# Patient Record
Sex: Male | Born: 1970
Health system: Southern US, Community
[De-identification: ages and names within clinical notes are randomized; demographics above are authoritative.]

## PROBLEM LIST (undated history)

## (undated) DIAGNOSIS — F329 Major depressive disorder, single episode, unspecified: Secondary | ICD-10-CM

## (undated) DIAGNOSIS — M25569 Pain in unspecified knee: Secondary | ICD-10-CM

## (undated) DIAGNOSIS — R51 Headache: Secondary | ICD-10-CM

## (undated) DIAGNOSIS — B019 Varicella without complication: Secondary | ICD-10-CM

## (undated) DIAGNOSIS — M25522 Pain in left elbow: Secondary | ICD-10-CM

## (undated) DIAGNOSIS — K219 Gastro-esophageal reflux disease without esophagitis: Secondary | ICD-10-CM

## (undated) DIAGNOSIS — F32A Depression, unspecified: Secondary | ICD-10-CM

## (undated) DIAGNOSIS — R519 Headache, unspecified: Secondary | ICD-10-CM

## (undated) DIAGNOSIS — F1010 Alcohol abuse, uncomplicated: Secondary | ICD-10-CM

## (undated) DIAGNOSIS — G43909 Migraine, unspecified, not intractable, without status migrainosus: Secondary | ICD-10-CM

## (undated) HISTORY — DX: Migraine, unspecified, not intractable, without status migrainosus: G43.909

## (undated) HISTORY — DX: Headache, unspecified: R51.9

## (undated) HISTORY — DX: Pain in left elbow: M25.522

## (undated) HISTORY — DX: Gastro-esophageal reflux disease without esophagitis: K21.9

## (undated) HISTORY — PX: CYSTECTOMY: SUR359

## (undated) HISTORY — DX: Depression, unspecified: F32.A

## (undated) HISTORY — PX: CYST REMOVAL HAND: SHX6279

## (undated) HISTORY — DX: Varicella without complication: B01.9

## (undated) HISTORY — DX: Pain in unspecified knee: M25.569

## (undated) HISTORY — DX: Alcohol abuse, uncomplicated: F10.10

## (undated) HISTORY — DX: Headache: R51

## (undated) HISTORY — DX: Major depressive disorder, single episode, unspecified: F32.9

---

## 2013-08-17 ENCOUNTER — Telehealth: Payer: Self-pay | Admitting: Family Medicine

## 2013-08-17 NOTE — Telephone Encounter (Signed)
We really need to encourage some of the new providers to patients who are calling .Marland Kitchen. We can offer this to him.

## 2013-08-17 NOTE — Telephone Encounter (Signed)
Pt's aunt Juan Holloway, would like to know if you will accept pt? Pt is self pay at the moment, but trying to get insurance. Per teresa, Pt has some bad emotional issues, has been out of work 3 days, and she states he feels like he's a "loaded gun" waiting to explode.  She thinks he has high bp too. Pls advise.

## 2013-08-19 NOTE — Telephone Encounter (Signed)
Juan Landaueresa Penn states that pt really needs to sees someone. Pt has anxiety, will Juan Holloway see Juan Holloway at least for now?  pls advise if ok to schedule for monday

## 2013-08-23 ENCOUNTER — Ambulatory Visit (INDEPENDENT_AMBULATORY_CARE_PROVIDER_SITE_OTHER): Payer: Self-pay | Admitting: Physician Assistant

## 2013-08-23 ENCOUNTER — Encounter: Payer: Self-pay | Admitting: Physician Assistant

## 2013-08-23 VITALS — BP 120/80 | HR 97 | Temp 97.6°F | Resp 18 | Ht 66.5 in | Wt 215.0 lb

## 2013-08-23 DIAGNOSIS — F32A Depression, unspecified: Secondary | ICD-10-CM

## 2013-08-23 DIAGNOSIS — F411 Generalized anxiety disorder: Secondary | ICD-10-CM

## 2013-08-23 DIAGNOSIS — M25569 Pain in unspecified knee: Secondary | ICD-10-CM

## 2013-08-23 DIAGNOSIS — M25562 Pain in left knee: Principal | ICD-10-CM

## 2013-08-23 DIAGNOSIS — F3289 Other specified depressive episodes: Secondary | ICD-10-CM

## 2013-08-23 DIAGNOSIS — F329 Major depressive disorder, single episode, unspecified: Secondary | ICD-10-CM

## 2013-08-23 DIAGNOSIS — G8929 Other chronic pain: Secondary | ICD-10-CM | POA: Insufficient documentation

## 2013-08-23 MED ORDER — NAPROXEN 500 MG PO TABS: 500.0000 mg | ORAL_TABLET | Freq: Two times a day (BID) | ORAL | Status: DC

## 2013-08-23 NOTE — Patient Instructions (Signed)
We will call you with the results of your lab work when they are available.  [facility] schedule an appointment for you treatment options of your depression/anxiety.  Naproxen 500 mg twice daily with a meal for pain.  Use your knee brace on your knee daily to stabilize.  Rest, ice, compression, elevation therapy as discussed below.  If emergency symptoms discussed during visit developed, seek medical attention immediately.  Followup as needed, or for worsening or persistent symptoms despite treatment.  Schedule an annual physical today prior to leaving the office for the next 3 months or so.    Depression, Adult Depression is feeling sad, low, down in the dumps, blue, gloomy, or empty. In general, there are two kinds of depression:  Normal sadness or grief. This can happen after something upsetting. It often goes away on its own within 2 weeks. After losing a loved one (bereavement), normal sadness and grief may last longer than two weeks. It usually gets better with time.  Clinical depression. This kind lasts longer than normal sadness or grief. It keeps you from doing the things you normally do in life. It is often hard to function at home, work, or at school. It may affect your relationships with others. Treatment is often needed. GET HELP RIGHT AWAY IF:  You have thoughts about hurting yourself or others.  You lose touch with reality (psychotic symptoms). You may:  See or hear things that are not real.  Have untrue beliefs about your life or people around you.  Your medicine is giving you problems. MAKE SURE YOU:  Understand these instructions.  Will watch your condition.  Will get help right away if you are not doing well or get worse. Document Released: 03/16/2010 Document Revised: 11/06/2011 Document Reviewed: 06/13/2011 [facility] Patient Information 2015 [place], [place]. This information is not intended to replace advice  given to you by your health care provider. Make sure you discuss any questions you have with your health care provider. Knee Pain Knee pain can be a result of an injury or other medical conditions. Treatment will depend on the cause of your pain. HOME CARE  Only take medicine as told by your doctor.  Keep a healthy weight. Being overweight can make the knee hurt more.  Stretch before exercising or playing sports.  If there is constant knee pain, change the way you exercise. Ask your doctor for advice.  Make sure shoes fit well. Choose the right shoe for the sport or activity.  Protect your knees. Wear kneepads if needed.  Rest when you are tired. GET HELP RIGHT AWAY IF:   Your knee pain does not stop.  Your knee pain does not get better.  Your knee joint feels hot to the touch.  You have a fever. MAKE SURE YOU:   Understand these instructions.  Will watch this condition.  Will get help right away if you are not doing well or get worse. Document Released: 05/10/2008 Document Revised: 05/06/2011 Document Reviewed: 05/10/2008 [facility] Patient Information 2015 [place], [place]. This information is not intended to replace advice given to you by your health care provider. Make sure you discuss any questions you have with your health care provider. RICE: Routine Care for Injuries Rest, Ice, Compression, and Elevation (RICE) are often used to care for injuries. HOME CARE  Rest your injury.  Put ice on the injury.  Put ice in a plastic bag.  Place a towel between your skin and  the bag.  Leave the ice on for 15-20 minutes, 03-04 times a day. Do this for as long as told by your doctor.  Apply pressure (compression) with an elastic bandage. Remove and reapply the bandage every 3 to 4 hours. Do not wrap the bandage too tight. Wrap the bandage looser if the fingers or toes are puffy (swollen), blue, cold, painful, or lose feeling (numb).  Raise (elevate) your injury. Raise your  injury above the heart if you can. GET HELP RIGHT AWAY IF:  You have lasting pain or puffiness.  Your injury is red, weak, or loses feeling.  Your problems get worse, not better, after several days. MAKE SURE YOU:  Understand these instructions.  Will watch your condition.  Will get help right away if you are not doing well or get worse. Document Released: 07/31/2007 Document Revised: 05/06/2011 Document Reviewed: 07/13/2010 [facility] Patient Information 2015 [place], [place]. This information is not intended to replace advice given to you by your health care provider. Make sure you discuss any questions you have with your health care provider.
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
## 2013-08-24 LAB — HEPATIC FUNCTION PANEL
ALBUMIN: 4.1 g/dL (ref 3.5–5.2)
ALK PHOS: 73 U/L (ref 39–117)
ALT: 30 U/L (ref 0–53)
AST: 27 U/L (ref 0–37)
Bilirubin, Direct: 0 mg/dL (ref 0.0–0.3)
Total Protein: 7 g/dL (ref 6.0–8.3)

## 2013-08-24 LAB — CBC WITH DIFFERENTIAL/PLATELET
BASOS ABS: 0.1 10*3/uL (ref 0.0–0.1)
Basophils Relative: 0.6 % (ref 0.0–3.0)
EOS ABS: 0.1 10*3/uL (ref 0.0–0.7)
Eosinophils Relative: 1.2 % (ref 0.0–5.0)
HCT: 47.5 % (ref 39.0–52.0)
HEMOGLOBIN: 16.2 g/dL (ref 13.0–17.0)
LYMPHS PCT: 30.9 % (ref 12.0–46.0)
Lymphs Abs: 3.1 10*3/uL (ref 0.7–4.0)
MCHC: 34.2 g/dL (ref 30.0–36.0)
MCV: 88.8 fl (ref 78.0–100.0)
Monocytes Absolute: 0.7 10*3/uL (ref 0.1–1.0)
Monocytes Relative: 7.2 % (ref 3.0–12.0)
NEUTROS ABS: 6 10*3/uL (ref 1.4–7.7)
Neutrophils Relative %: 60.1 % (ref 43.0–77.0)
Platelets: 263 10*3/uL (ref 150.0–400.0)
RBC: 5.35 Mil/uL (ref 4.22–5.81)
RDW: 13.6 % (ref 11.5–15.5)
WBC: 10 10*3/uL (ref 4.0–10.5)

## 2013-08-24 LAB — TSH: TSH: 0.16 u[IU]/mL — ABNORMAL LOW (ref 0.35–4.50)

## 2013-08-30 ENCOUNTER — Other Ambulatory Visit: Payer: Self-pay | Admitting: Physician Assistant

## 2013-08-30 DIAGNOSIS — R7989 Other specified abnormal findings of blood chemistry: Secondary | ICD-10-CM

## 2014-10-28 ENCOUNTER — Telehealth: Payer: Self-pay | Admitting: Physician Assistant

## 2014-10-28 NOTE — Telephone Encounter (Signed)
Pt states he works all the time. Has no time to come in. Would like to make an appt for a "check up" But cannot schedule at this time.  Advised pt it would be a good idea if he could make that est appt

## 2015-11-02 ENCOUNTER — Encounter: Payer: Self-pay | Admitting: Family Medicine

## 2015-11-02 ENCOUNTER — Ambulatory Visit (INDEPENDENT_AMBULATORY_CARE_PROVIDER_SITE_OTHER): Payer: 59

## 2015-11-02 ENCOUNTER — Ambulatory Visit (INDEPENDENT_AMBULATORY_CARE_PROVIDER_SITE_OTHER): Payer: 59 | Admitting: Family Medicine

## 2015-11-02 VITALS — BP 135/77 | HR 67 | Temp 98.7°F | Ht 68.0 in | Wt 220.0 lb

## 2015-11-02 DIAGNOSIS — M7731 Calcaneal spur, right foot: Secondary | ICD-10-CM | POA: Diagnosis not present

## 2015-11-02 DIAGNOSIS — M25571 Pain in right ankle and joints of right foot: Secondary | ICD-10-CM

## 2015-11-02 DIAGNOSIS — G5761 Lesion of plantar nerve, right lower limb: Secondary | ICD-10-CM | POA: Diagnosis not present

## 2015-11-02 MED ORDER — DICLOFENAC SODIUM 75 MG PO TBEC: 75.0000 mg | DELAYED_RELEASE_TABLET | Freq: Two times a day (BID) | ORAL | 2 refills | Status: DC

## 2015-11-02 NOTE — Progress Notes (Signed)
Subjective:  Patient ID: Juan Holloway, male    DOB: 02-12-1971  Age: 45 y.o. MRN: [id number]  CC: New Patient (Initial Visit) (pt here today as a new pt with new onset of right foot pain. No recent injury noted but he states he can barely walk on it.)   HPI Juan Holloway presents for 3 days of increasing pain in the right foot. There is an acute 7-8/10 pain at the base of the right first toe. There is some chronic pain at the plantar surface of the heel that has been there for about a month. This is lower grade dull ache. The every pain is described as a stabbing sensation. The right second toe is numb. Patient has not been to a doctor in many years says he had some blood work several years ago. He has no known history of gout. He also states he's not had an injury either recently or in the past to this foot. He works all day on his feet as a [employer]at[employer]ic.  History Juan Holloway has a past medical history of Alcohol abuse; Chicken pox; Depression; Frequent headaches; GERD (gastroesophageal reflux disease); Knee pain; Left elbow pain; and Migraines.   He has a past surgical history that includes Cyst removal hand and Cystectomy.   His family history includes Alcoholism in his paternal grandmother; Diabetes in his father, mother, paternal grandfather, and paternal grandmother; Hypertension in his brother, father, mother, and paternal grandmother; Mental illness in his mother.He reports that he has been smoking Cigarettes.  He has been smoking about 2.00 packs per day. He has never used smokeless tobacco. He reports that he uses drugs. He reports that he does not drink alcohol.  No current outpatient prescriptions on file prior to visit.   No current facility-administered medications on file prior to visit.     ROS Review of Systems  Constitutional: Negative for chills, diaphoresis, fever and unexpected weight change.  HENT: Negative for congestion, hearing loss, rhinorrhea and sore throat.   Eyes:  Negative for visual disturbance.  Respiratory: Negative for cough and shortness of breath.   Cardiovascular: Negative for chest pain.  Gastrointestinal: Negative for abdominal pain, constipation and diarrhea.  Genitourinary: Negative for dysuria and flank pain.  Musculoskeletal: Negative for arthralgias and joint swelling.  Skin: Negative for rash.  Neurological: Negative for dizziness and headaches.  Psychiatric/Behavioral: Negative for dysphoric mood and sleep disturbance.    Objective:  BP 135/77   Pulse 67   Temp 98.7 F (37.1 C) (Oral)   Ht 5\' 8"  (1.727 m)   Wt 220 lb (99.8 kg)   BMI 33.45 kg/m   Physical Exam  Constitutional: He appears well-developed and well-nourished.  HENT:  Head: Normocephalic and atraumatic.  Right Ear: Tympanic membrane and external ear normal. No decreased hearing is noted.  Left Ear: Tympanic membrane and external ear normal. No decreased hearing is noted.  Mouth/Throat: No oropharyngeal exudate or posterior oropharyngeal erythema.  Eyes: Pupils are equal, round, and reactive to light.  Neck: Normal range of motion. Neck supple.  Cardiovascular: Normal rate and regular rhythm.   No murmur heard. Pulmonary/Chest: Breath sounds normal. No respiratory distress.  Abdominal: Soft. There is no tenderness.  Musculoskeletal: Normal range of motion. He exhibits tenderness (base of Right first MTP and interspace with  right 2nd MTP).  Vitals reviewed.  XR - right plantar heel spur. No active disease at toe  Assessment & Plan:   Juan Holloway was seen today for new patient (initial visit).  Diagnoses and all orders for this visit:  Pain in joint, ankle and foot, right -     DG Foot Complete Right; Future  Morton's metatarsalgia, neuralgia, or neuroma, right  Heel spur, right  Other orders -     diclofenac (VOLTAREN) 75 MG EC tablet; Take 1 tablet (75 mg total) by mouth 2 (two) times daily. For muscle and  Joint pain   I have discontinued Juan Holloway naproxen. I am also having him start on diclofenac.  Meds ordered this encounter  Medications  . diclofenac (VOLTAREN) 75 MG EC tablet    Sig: Take 1 tablet (75 mg total) by mouth 2 (two) times daily. For muscle and  Joint pain    Dispense:  60 tablet    Refill:  2     Follow-up: Return in about 2 weeks (around 11/16/2015) for Wellness.  Mechele Claude, M.D.

## 2015-11-08 ENCOUNTER — Ambulatory Visit: Payer: Self-pay | Admitting: Family Medicine

## 2015-11-20 ENCOUNTER — Ambulatory Visit (INDEPENDENT_AMBULATORY_CARE_PROVIDER_SITE_OTHER): Payer: 59 | Admitting: Family Medicine

## 2015-11-20 ENCOUNTER — Encounter: Payer: Self-pay | Admitting: Family Medicine

## 2015-11-20 VITALS — BP 130/72 | HR 70 | Temp 97.5°F | Ht 68.0 in | Wt 221.0 lb

## 2015-11-20 DIAGNOSIS — Z0000 Encounter for general adult medical examination without abnormal findings: Secondary | ICD-10-CM

## 2015-11-20 DIAGNOSIS — G5761 Lesion of plantar nerve, right lower limb: Secondary | ICD-10-CM | POA: Diagnosis not present

## 2015-11-20 DIAGNOSIS — IMO0001 Reserved for inherently not codable concepts without codable children: Secondary | ICD-10-CM

## 2015-11-20 LAB — URINALYSIS
BILIRUBIN UA: NEGATIVE
Glucose, UA: NEGATIVE
KETONES UA: NEGATIVE
LEUKOCYTES UA: NEGATIVE
NITRITE UA: NEGATIVE
PH UA: 6 (ref 5.0–7.5)
Protein, UA: NEGATIVE
RBC UA: NEGATIVE
SPEC GRAV UA: 1.015 (ref 1.005–1.030)
Urobilinogen, Ur: 0.2 mg/dL (ref 0.2–1.0)

## 2015-11-20 NOTE — Patient Instructions (Signed)

## 2015-11-20 NOTE — Progress Notes (Addendum)
Subjective:  Patient ID: Juan Holloway, male    DOB: 1970/12/08  Age: 45 y.o. MRN: [id number]  CC: Annual Exam   HPI Juan Holloway presents for New patient CPE Depression screen [facility] 2/9 11/20/2015 11/02/2015  Decreased Interest 0 0  Down, Depressed, Hopeless 0 0  PHQ - 2 Score 0 0  Foot pain slightly decreased. Still 5-6/10 in spite of diclofenac.  History Juan Holloway has a past medical history of Alcohol abuse; Chicken pox; Depression; Frequent headaches; GERD (gastroesophageal reflux disease); Knee pain; Left elbow pain; and Migraines.   He has a past surgical history that includes Cyst removal hand and Cystectomy.   His family history includes Alcoholism in his paternal grandmother; Diabetes in his father, mother, paternal grandfather, and paternal grandmother; Hypertension in his brother, father, mother, and paternal grandmother; Mental illness in his mother.He reports that he has been smoking Cigarettes.  He has been smoking about 2.00 packs per day. He has never used smokeless tobacco. He reports that he uses drugs. He reports that he does not drink alcohol.  Current Outpatient Prescriptions on File Prior to Visit  Medication Sig Dispense Refill  . diclofenac (VOLTAREN) 75 MG EC tablet Take 1 tablet (75 mg total) by mouth 2 (two) times daily. For muscle and  Joint pain 60 tablet 2   No current facility-administered medications on file prior to visit.     ROS Review of Systems  Constitutional: Negative for activity change, appetite change, chills, diaphoresis, fatigue, fever and unexpected weight change.  HENT: Negative for congestion, ear pain, hearing loss, postnasal drip, rhinorrhea, sore throat, tinnitus and trouble swallowing.   Eyes: Negative for photophobia, pain, discharge and redness.  Respiratory: Negative for apnea, cough, choking, chest tightness, shortness of breath, wheezing and stridor.   Cardiovascular: Negative for chest pain, palpitations and leg swelling.    Gastrointestinal: Negative for abdominal distention, abdominal pain, blood in stool, constipation, diarrhea, nausea and vomiting.  Endocrine: Negative for cold intolerance, heat intolerance, polydipsia, polyphagia and polyuria.  Genitourinary: Negative for difficulty urinating, dysuria, enuresis, flank pain, frequency, genital sores, hematuria and urgency.  Musculoskeletal: Positive for arthralgias (knees - improved with diclofenac). Negative for joint swelling.  Skin: Negative for color change, rash and wound.  Allergic/Immunologic: Negative for immunocompromised state.  Neurological: Negative for dizziness, tremors, seizures, syncope, facial asymmetry, speech difficulty, weakness, light-headedness, numbness and headaches.  Hematological: Does not bruise/bleed easily.  Psychiatric/Behavioral: Negative for agitation, behavioral problems, confusion, decreased concentration, dysphoric mood, hallucinations, sleep disturbance and suicidal ideas. The patient is not nervous/anxious and is not hyperactive.     Objective:  BP 130/72   Pulse 70   Temp 97.5 F (36.4 C) (Oral)   Ht '5\' 8"'  (1.727 m)   Wt 221 lb (100.2 kg)   BMI 33.60 kg/m   Physical Exam  Constitutional: He is oriented to person, place, and time. He appears well-developed and well-nourished.  HENT:  Head: Normocephalic and atraumatic.  Mouth/Throat: Oropharynx is clear and moist.  Eyes: EOM are normal. Pupils are equal, round, and reactive to light.  Neck: Normal range of motion. No tracheal deviation present. No thyromegaly present.  Cardiovascular: Normal rate, regular rhythm and normal heart sounds.  Exam reveals no gallop and no friction rub.   No murmur heard. Pulmonary/Chest: Breath sounds normal. He has no wheezes. He has no rales.  Abdominal: Soft. He exhibits no mass. There is no tenderness.  Musculoskeletal: Normal range of motion. He exhibits tenderness (plantar surface, base of first toe). He  exhibits no edema.   Neurological: He is alert and oriented to person, place, and time.  Skin: Skin is warm and dry.  Psychiatric: He has a normal mood and affect.    Assessment & Plan:   Juan Holloway was seen today for annual exam.  Diagnoses and all orders for this visit:  Well adult -     CBC with Differential/Platelet -     CMP14+EGFR -     Lipid panel -     PSA Total (Reflex To Free) -     Urinalysis  Morton's metatarsalgia, neuralgia, or neuroma, right -     Ambulatory referral to Podiatry  A steroid injection was performed at the plantar aspect of the 1-2 MTP joint usingsterile prep & drape injected 1 ml marcan and 3 mg of Celestone. This was well tolerated.  I am having Juan Holloway maintain his diclofenac.  No orders of the defined types were placed in this encounter.  Calorie counting for weight loss printed  Follow-up: Return in about 1 year (around 11/19/2016).  Claretta Fraise, M.D.

## 2015-11-21 ENCOUNTER — Telehealth: Payer: Self-pay | Admitting: Family Medicine

## 2015-11-21 ENCOUNTER — Other Ambulatory Visit: Payer: Self-pay | Admitting: *Deleted

## 2015-11-21 DIAGNOSIS — E785 Hyperlipidemia, unspecified: Secondary | ICD-10-CM

## 2015-11-21 LAB — CMP14+EGFR
ALT: 49 IU/L — AB (ref 0–44)
AST: 30 IU/L (ref 0–40)
Albumin/Globulin Ratio: 1.8 (ref 1.2–2.2)
Albumin: 4.4 g/dL (ref 3.5–5.5)
Alkaline Phosphatase: 82 IU/L (ref 39–117)
BUN/Creatinine Ratio: 13 (ref 9–20)
BUN: 14 mg/dL (ref 6–24)
Bilirubin Total: 0.4 mg/dL (ref 0.0–1.2)
CALCIUM: 9.5 mg/dL (ref 8.7–10.2)
CO2: 25 mmol/L (ref 18–29)
Chloride: 98 mmol/L (ref 96–106)
Creatinine, Ser: 1.04 mg/dL (ref 0.76–1.27)
GFR, EST AFRICAN AMERICAN: 100 mL/min/{1.73_m2} (ref >59)
GFR, EST NON AFRICAN AMERICAN: 86 mL/min/{1.73_m2} (ref >59)
GLOBULIN, TOTAL: 2.5 g/dL (ref 1.5–4.5)
Glucose: 102 mg/dL — ABNORMAL HIGH (ref 65–99)
Potassium: 4.8 mmol/L (ref 3.5–5.2)
Sodium: 138 mmol/L (ref 134–144)
Total Protein: 6.9 g/dL (ref 6.0–8.5)

## 2015-11-21 LAB — CBC WITH DIFFERENTIAL/PLATELET
BASOS: 1 %
Basophils Absolute: 0 10*3/uL (ref 0.0–0.2)
EOS (ABSOLUTE): 0.1 10*3/uL (ref 0.0–0.4)
EOS: 1 %
HEMATOCRIT: 49.2 % (ref 37.5–51.0)
HEMOGLOBIN: 17.1 g/dL (ref 12.6–17.7)
IMMATURE GRANULOCYTES: 0 %
Immature Grans (Abs): 0 10*3/uL (ref 0.0–0.1)
LYMPHS: 38 %
Lymphocytes Absolute: 3 10*3/uL (ref 0.7–3.1)
MCH: 29.9 pg (ref 26.6–33.0)
MCHC: 34.8 g/dL (ref 31.5–35.7)
MCV: 86 fL (ref 79–97)
MONOCYTES: 10 %
Monocytes Absolute: 0.8 10*3/uL (ref 0.1–0.9)
NEUTROS ABS: 3.9 10*3/uL (ref 1.4–7.0)
Neutrophils: 50 %
PLATELETS: 276 10*3/uL (ref 150–379)
RBC: 5.72 x10E6/uL (ref 4.14–5.80)
RDW: 13.6 % (ref 12.3–15.4)
WBC: 7.8 10*3/uL (ref 3.4–10.8)

## 2015-11-21 LAB — PSA TOTAL (REFLEX TO FREE): Prostate Specific Ag, Serum: 1.1 ng/mL (ref 0.0–4.0)

## 2015-11-21 LAB — LIPID PANEL
CHOL/HDL RATIO: 7.6 ratio — AB (ref 0.0–5.0)
Cholesterol, Total: 228 mg/dL — ABNORMAL HIGH (ref 100–199)
HDL: 30 mg/dL — AB (ref >39)
Triglycerides: 485 mg/dL — ABNORMAL HIGH (ref 0–149)

## 2015-11-21 NOTE — Telephone Encounter (Signed)
Patient aware of results.

## 2016-01-26 ENCOUNTER — Other Ambulatory Visit: Payer: Self-pay | Admitting: Family Medicine

## 2016-03-05 ENCOUNTER — Other Ambulatory Visit: Payer: Self-pay | Admitting: Family Medicine

## 2016-04-05 ENCOUNTER — Other Ambulatory Visit: Payer: Self-pay | Admitting: Family Medicine

## 2016-06-20 ENCOUNTER — Encounter: Payer: Self-pay | Admitting: Family Medicine

## 2016-06-20 ENCOUNTER — Ambulatory Visit (INDEPENDENT_AMBULATORY_CARE_PROVIDER_SITE_OTHER): Payer: 59 | Admitting: Family Medicine

## 2016-06-20 ENCOUNTER — Ambulatory Visit (INDEPENDENT_AMBULATORY_CARE_PROVIDER_SITE_OTHER): Payer: 59

## 2016-06-20 VITALS — BP 131/83 | HR 71 | Temp 97.2°F | Ht 68.0 in | Wt 217.2 lb

## 2016-06-20 DIAGNOSIS — M25562 Pain in left knee: Secondary | ICD-10-CM

## 2016-06-20 MED ORDER — LIDOCAINE HCL 1 % IJ SOLN
10.0000 mL | Freq: Once | INTRAMUSCULAR | Status: AC
  Administered 2016-06-20: 10 mL via INTRADERMAL

## 2016-06-20 MED ORDER — TRIAMCINOLONE ACETONIDE 40 MG/ML IJ SUSP
40.0000 mg | Freq: Once | INTRAMUSCULAR | Status: AC
  Administered 2016-06-20: 40 mg via INTRAMUSCULAR

## 2016-06-20 NOTE — Patient Instructions (Signed)
Great to see you!  Come back with any concerns or for return of pain.
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
## 2016-06-20 NOTE — Addendum Note (Signed)
Addended by: Angela Adam on: 06/20/2016 04:14 PM   Modules accepted: Orders

## 2016-06-29 ENCOUNTER — Other Ambulatory Visit: Payer: Self-pay | Admitting: Family Medicine

## 2016-08-02 ENCOUNTER — Other Ambulatory Visit: Payer: Self-pay | Admitting: Family Medicine

## 2016-08-10 ENCOUNTER — Other Ambulatory Visit: Payer: Self-pay | Admitting: Family Medicine

## 2016-08-15 ENCOUNTER — Ambulatory Visit (INDEPENDENT_AMBULATORY_CARE_PROVIDER_SITE_OTHER): Payer: 59 | Admitting: Family Medicine

## 2016-08-15 ENCOUNTER — Encounter: Payer: Self-pay | Admitting: Family Medicine

## 2016-08-15 VITALS — BP 129/75 | HR 85 | Temp 97.5°F | Ht 68.0 in | Wt 212.6 lb

## 2016-08-15 DIAGNOSIS — M25562 Pain in left knee: Secondary | ICD-10-CM | POA: Diagnosis not present

## 2016-08-15 DIAGNOSIS — G8929 Other chronic pain: Secondary | ICD-10-CM | POA: Diagnosis not present

## 2016-08-15 DIAGNOSIS — R21 Rash and other nonspecific skin eruption: Secondary | ICD-10-CM

## 2016-08-15 MED ORDER — TRIAMCINOLONE ACETONIDE 0.5 % EX OINT: 1.0000 "application " | TOPICAL_OINTMENT | Freq: Two times a day (BID) | CUTANEOUS | 1 refills | Status: DC

## 2016-08-15 MED ORDER — PANTOPRAZOLE SODIUM 20 MG PO TBEC: 20.0000 mg | DELAYED_RELEASE_TABLET | Freq: Every day | ORAL | 5 refills | Status: DC

## 2016-08-15 MED ORDER — DICLOFENAC SODIUM 75 MG PO TBEC: 75.0000 mg | DELAYED_RELEASE_TABLET | Freq: Two times a day (BID) | ORAL | 3 refills | Status: DC

## 2016-08-15 NOTE — Patient Instructions (Signed)
Great to see you!  Try some time off of diclofenac if you can, if you need it twice daily start using protonix  1 pill once daily for it.   Try the ointment on your leg to see if it helps the bumps.

## 2016-08-15 NOTE — Progress Notes (Signed)
   HPI  Patient presents today here for follow-up chronic knee pain, also with rash.  Knee pain Left knee pain long-term, patient reports cracking and popping when he walks. He states that after his previous joint injection he's had much improvement. Patient states that he has not had much pain recently, however he takes diclofenac twice daily scheduled. We have talked extensively today about risk of peptic ulcer disease development, NSAID-induced nephropathy, and increased risk of MI with chronic NSAID use.  Rash Patient gets painful red bumps with intermittent white blisters on the bilateral posterior calves during the summer frequently. He believes this is due to heat.  PMH: Smoking status noted ROS: Per HPI  Objective: BP 129/75   Pulse 85   Temp 97.5 F (36.4 C) (Oral)   Ht 5\' 8"  (1.727 m)   Wt 212 lb 9.6 oz (96.4 kg)   BMI 32.33 kg/m  Gen: NAD, alert, cooperative with exam HEENT: NCAT CV: RRR, good S1/S2, no murmur Resp: CTABL, no wheezes, non-labored Ext: No edema, warm Neuro: Alert and oriented, No gross deficits MSK: L knee without erythema, effusion, bruising, or gross deformity No joint line tenderness.  ligamentously intact to Lachman's and with varus and valgus stress.  Negative McMurray's test  Skin 3 erythematous papules on the right calf approximately 5-8 mm in diameter, no drainage, no tenderness to palpation, 10-20 scattered smaller papules 1-3 mm in diameter distributed along bilateral posterior calves  Assessment and plan:  # Chronic knee pain Patient improved after knee injection, recommended a trial off of medication and using only as needed Refill Voltaren, explained that if he goes back to twice daily scheduled dosing I would recommend starting a PPI to prevent PUD. Offered/recommended orthopedic referral, patient states that this is difficult to afford at this time  # Rash Unclear etiology, trial of Kenalog ointment No red flags    Meds  ordered this encounter  Medications  . diclofenac (VOLTAREN) 75 MG EC tablet    Sig: Take 1 tablet (75 mg total) by mouth 2 (two) times daily. For muscle and  Joint pain    Dispense:  60 tablet    Refill:  3  . pantoprazole (PROTONIX) 20 MG tablet    Sig: Take 1 tablet (20 mg total) by mouth daily.    Dispense:  30 tablet    Refill:  5  . triamcinolone ointment (KENALOG) 0.5 %    Sig: Apply 1 application topically 2 (two) times daily.    Dispense:  30 g    Refill:  1    Murtis SinkSam Bradshaw, MD [facility] 08/15/2016, 5:10 PM

## 2017-01-12 ENCOUNTER — Other Ambulatory Visit: Payer: Self-pay | Admitting: Family Medicine

## 2017-01-13 NOTE — Telephone Encounter (Signed)
Last seen 08/15/16  Dr B

## 2017-03-13 ENCOUNTER — Other Ambulatory Visit: Payer: Self-pay | Admitting: Family Medicine

## 2017-03-13 NOTE — Telephone Encounter (Signed)
Last seen 08/15/16  Dr Ermalinda MemosBradshaw

## 2017-04-12 ENCOUNTER — Other Ambulatory Visit: Payer: Self-pay | Admitting: Family Medicine

## 2017-04-14 NOTE — Telephone Encounter (Signed)
Authorize 30 days only. Then contact the patient letting them know that they will need an appointment before any further prescriptions can be sent in. 

## 2017-04-14 NOTE — Telephone Encounter (Signed)
Attempted to contact patient - NA °

## 2017-04-14 NOTE — Telephone Encounter (Signed)
Last seen 08/15/16

## 2017-04-16 ENCOUNTER — Other Ambulatory Visit: Payer: Self-pay | Admitting: Family Medicine

## 2017-04-21 ENCOUNTER — Encounter: Payer: Self-pay | Admitting: Family Medicine

## 2017-04-21 ENCOUNTER — Ambulatory Visit: Payer: BLUE CROSS/BLUE SHIELD | Admitting: Family Medicine

## 2017-04-21 VITALS — BP 128/68 | HR 75 | Temp 96.8°F | Ht 68.0 in | Wt 219.0 lb

## 2017-04-21 DIAGNOSIS — M545 Low back pain, unspecified: Secondary | ICD-10-CM

## 2017-04-21 MED ORDER — METHYLPREDNISOLONE ACETATE 80 MG/ML IJ SUSP
80.0000 mg | Freq: Once | INTRAMUSCULAR | Status: AC
  Administered 2017-04-21: 80 mg via INTRAMUSCULAR

## 2017-04-21 MED ORDER — PREDNISONE 20 MG PO TABS: 40.0000 mg | ORAL_TABLET | Freq: Every day | ORAL | 0 refills | Status: DC

## 2017-04-21 MED ORDER — TIZANIDINE HCL 4 MG PO CAPS: 4.0000 mg | ORAL_CAPSULE | Freq: Three times a day (TID) | ORAL | 0 refills | Status: DC | PRN

## 2017-04-21 MED ORDER — OXYCODONE-ACETAMINOPHEN 5-325 MG PO TABS: 1.0000 | ORAL_TABLET | Freq: Four times a day (QID) | ORAL | 0 refills | Status: DC | PRN

## 2017-04-21 NOTE — Progress Notes (Signed)
   HPI  Patient presents today here with back pain.  Patient complains of acute onset right-sided lower thoracic/upper lumbar back pain.  Patient states that he was driving to work on Friday morning when he sneezed and had a sudden onset of the pain.  He states when he got out of his car at work he had severe pain. He was able to finish his day states that over the weekend is beginning to get worse and has had a difficult time even getting up to walk.  Patient states that it hurts on the right side of the spine and radiates around to the mid axillary area. No bowel or bladder dysfunction, no saddle in the  He is tried Aleve with no improvement. He is also tried Biofreeze with no improvement  PMH: Smoking status noted ROS: Per HPI  Objective: BP 128/68   Pulse 75   Temp (!) 96.8 F (36 C) (Oral)   Ht 5\' 8"  (1.727 m)   Wt 219 lb (99.3 kg)   BMI 33.30 kg/m  Gen: NAD, alert, cooperative with exam, patient clearly in pain HEENT: NCAT CV: RRR, good S1/S2, no murmur Resp: CTABL, no wheezes, non-labored Ext: No edema, warm Neuro: Alert and oriented, strength 5/5 and sensation intact in bilateral lower patellar tendon reflexes MSK Tenderness to palpation of right-sided paraspinal muscles in the upper lumbar/lower thoracic area.   Assessment and plan:  #Right-sided low back pain without sciatica Treating with IM Depo-Medrol today plus prednisone burst. Also given Zanaflex for muscle relaxer Also given short course of Percocet for severe pain, we have discussed that this is a short-term medication and will not be refilled.   Meds ordered this encounter  Medications  . oxyCODONE-acetaminophen (PERCOCET/ROXICET) 5-325 MG tablet    Sig: Take 1 tablet by mouth every 6 (six) hours as needed for severe pain.    Dispense:  20 tablet    Refill:  0  . predniSONE (DELTASONE) 20 MG tablet    Sig: Take 2 tablets (40 mg total) by mouth daily with breakfast.    Dispense:  10 tablet   Refill:  0  . tiZANidine (ZANAFLEX) 4 MG capsule    Sig: Take 1 capsule (4 mg total) by mouth 3 (three) times daily as needed for muscle spasms.    Dispense:  30 capsule    Refill:  0  . methylPREDNISolone acetate (DEPO-MEDROL) injection 80 mg    Murtis SinkSam Bradshaw, MD [facility] 04/21/2017, 1:35 PM

## 2017-04-21 NOTE — Patient Instructions (Signed)
Great to see you!  Start prednisone today, use tizanidine at night.   Use oxycodone only as needed, do not drive after you take it.

## 2017-05-16 ENCOUNTER — Other Ambulatory Visit: Payer: Self-pay | Admitting: Family Medicine

## 2017-08-08 ENCOUNTER — Other Ambulatory Visit: Payer: Self-pay | Admitting: Family Medicine

## 2017-09-12 ENCOUNTER — Other Ambulatory Visit: Payer: Self-pay | Admitting: Family Medicine

## 2017-09-12 NOTE — Telephone Encounter (Signed)
Last seen 04/21/17  Dr Ermalinda MemosBradshaw  Dr Darlyn ReadStacks PCP

## 2017-10-21 ENCOUNTER — Encounter: Payer: Self-pay | Admitting: Family Medicine

## 2017-10-21 ENCOUNTER — Ambulatory Visit: Payer: BLUE CROSS/BLUE SHIELD | Admitting: Family Medicine

## 2017-10-21 VITALS — BP 132/77 | HR 64 | Temp 97.7°F | Ht 68.0 in | Wt 217.5 lb

## 2017-10-21 DIAGNOSIS — F3289 Other specified depressive episodes: Secondary | ICD-10-CM

## 2017-10-21 MED ORDER — ESCITALOPRAM OXALATE 10 MG PO TABS: 10.0000 mg | ORAL_TABLET | Freq: Every day | ORAL | 0 refills | Status: DC

## 2017-10-21 NOTE — Progress Notes (Signed)
Subjective:  Patient ID: Arun Herrod, male    DOB: 04-19-1970  Age: 47 y.o. MRN: [id number]  CC: Depression   HPI Emigdio Wildeman presents for increasing sadness, lack of interest, sadness. Sx noted below. Onset several weeks ago. Worsening daily.  Depression screen [facility] 2/9 10/21/2017 04/21/2017 08/15/2016  Decreased Interest 3 0 0  Down, Depressed, Hopeless 3 0 0  PHQ - 2 Score 6 0 0  Altered sleeping 1 - -  Tired, decreased energy 2 - -  Change in appetite 2 - -  Feeling bad or failure about yourself  3 - -  Trouble concentrating 1 - -  Moving slowly or fidgety/restless 2 - -  Suicidal thoughts 3 - -  PHQ-9 Score 20 - -    History Theresa has a past medical history of Alcohol abuse, Chicken pox, Depression, Frequent headaches, GERD (gastroesophageal reflux disease), Knee pain, Left elbow pain, and Migraines.   He has a past surgical history that includes Cyst removal hand and Cystectomy.   His family history includes Alcoholism in his paternal grandmother; Arthritis in his unknown relative; Diabetes in his father, mother, paternal grandfather, and paternal grandmother; Hypertension in his brother, father, mother, and paternal grandmother; Mental illness in his mother.He reports that he has been smoking cigarettes. He has been smoking about 2.00 packs per day. He has never used smokeless tobacco. He reports that he has current or past drug history. He reports that he does not drink alcohol.    ROS Review of Systems  Constitutional: Negative.   HENT: Negative.   Eyes: Negative for visual disturbance.  Respiratory: Negative for cough and shortness of breath.   Cardiovascular: Negative for chest pain and leg swelling.  Gastrointestinal: Negative for abdominal pain, diarrhea, nausea and vomiting.  Genitourinary: Negative for difficulty urinating.  Musculoskeletal: Negative for arthralgias and myalgias.  Skin: Negative for rash.  Neurological: Negative for headaches.    Psychiatric/Behavioral: Negative for sleep disturbance.    Objective:  BP 132/77   Pulse 64   Temp 97.7 F (36.5 C) (Oral)   Ht '5\' 8"'  (1.727 m)   Wt 217 lb 8 oz (98.7 kg)   BMI 33.07 kg/m   BP Readings from Last 3 Encounters:  10/21/17 132/77  04/21/17 128/68  08/15/16 129/75    Wt Readings from Last 3 Encounters:  10/21/17 217 lb 8 oz (98.7 kg)  04/21/17 219 lb (99.3 kg)  08/15/16 212 lb 9.6 oz (96.4 kg)     Physical Exam  Constitutional: He is oriented to person, place, and time. He appears well-developed and well-nourished. No distress.  HENT:  Head: Normocephalic and atraumatic.  Right Ear: External ear normal.  Left Ear: External ear normal.  Nose: Nose normal.  Mouth/Throat: Oropharynx is clear and moist.  Eyes: Pupils are equal, round, and reactive to light. Conjunctivae and EOM are normal.  Neck: Normal range of motion. Neck supple.  Cardiovascular: Normal rate, regular rhythm and normal heart sounds.  No murmur heard. Pulmonary/Chest: Effort normal and breath sounds normal. No respiratory distress. He has no wheezes. He has no rales.  Abdominal: Soft. There is no tenderness.  Musculoskeletal: Normal range of motion.  Neurological: He is alert and oriented to person, place, and time. He has normal reflexes.  Skin: Skin is warm and dry.  Psychiatric: Judgment and thought content normal. His affect is blunt. His speech is delayed. He is slowed and withdrawn. Cognition and memory are normal. He exhibits a depressed mood.  Assessment & Plan:   Theophil was seen today for depression.  Diagnoses and all orders for this visit:  Other depression -     CBC with Differential/Platelet -     CMP14+EGFR -     TSH  Other orders -     escitalopram (LEXAPRO) 10 MG tablet; Take 1 tablet (10 mg total) by mouth at bedtime.       I have discontinued Layla Barter triamcinolone ointment, oxyCODONE-acetaminophen, predniSONE, and tiZANidine. I am also having  him start on escitalopram. Additionally, I am having him maintain his pantoprazole and diclofenac.  Allergies as of 10/21/2017   No Known Allergies     Medication List        Accurate as of 10/21/17 11:59 PM. Always use your most recent med list.          diclofenac 75 MG EC tablet Commonly known as:  VOLTAREN TAKE 1 TABLET (75 MG TOTAL) BY MOUTH 2 (TWO) TIMES DAILY. FOR MUSCLE AND JOINT PAIN   escitalopram 10 MG tablet Commonly known as:  LEXAPRO Take 1 tablet (10 mg total) by mouth at bedtime.   pantoprazole 20 MG tablet Commonly known as:  PROTONIX TAKE 1 TABLET BY MOUTH EVERY DAY        Follow-up: No follow-ups on file.  Claretta Fraise, M.D.

## 2017-10-22 LAB — CMP14+EGFR
ALK PHOS: 82 IU/L (ref 39–117)
ALT: 37 IU/L (ref 0–44)
AST: 24 IU/L (ref 0–40)
Albumin/Globulin Ratio: 1.7 (ref 1.2–2.2)
Albumin: 4.3 g/dL (ref 3.5–5.5)
BILIRUBIN TOTAL: 0.2 mg/dL (ref 0.0–1.2)
BUN/Creatinine Ratio: 14 (ref 9–20)
BUN: 15 mg/dL (ref 6–24)
CHLORIDE: 101 mmol/L (ref 96–106)
CO2: 26 mmol/L (ref 20–29)
CREATININE: 1.07 mg/dL (ref 0.76–1.27)
Calcium: 9.2 mg/dL (ref 8.7–10.2)
GFR calc Af Amer: 95 mL/min/{1.73_m2} (ref >59)
GFR calc non Af Amer: 82 mL/min/{1.73_m2} (ref >59)
Globulin, Total: 2.5 g/dL (ref 1.5–4.5)
Glucose: 117 mg/dL — ABNORMAL HIGH (ref 65–99)
Potassium: 4.3 mmol/L (ref 3.5–5.2)
Sodium: 142 mmol/L (ref 134–144)
Total Protein: 6.8 g/dL (ref 6.0–8.5)

## 2017-10-22 LAB — CBC WITH DIFFERENTIAL/PLATELET
Basophils Absolute: 0 10*3/uL (ref 0.0–0.2)
Basos: 0 %
EOS (ABSOLUTE): 0.1 10*3/uL (ref 0.0–0.4)
Eos: 2 %
Hematocrit: 45.5 % (ref 37.5–51.0)
Hemoglobin: 15.9 g/dL (ref 13.0–17.7)
IMMATURE GRANS (ABS): 0 10*3/uL (ref 0.0–0.1)
Immature Granulocytes: 0 %
LYMPHS ABS: 3.7 10*3/uL — AB (ref 0.7–3.1)
LYMPHS: 45 %
MCH: 30.2 pg (ref 26.6–33.0)
MCHC: 34.9 g/dL (ref 31.5–35.7)
MCV: 87 fL (ref 79–97)
MONOCYTES: 9 %
Monocytes Absolute: 0.7 10*3/uL (ref 0.1–0.9)
NEUTROS ABS: 3.6 10*3/uL (ref 1.4–7.0)
Neutrophils: 44 %
PLATELETS: 251 10*3/uL (ref 150–450)
RBC: 5.26 x10E6/uL (ref 4.14–5.80)
RDW: 13.6 % (ref 12.3–15.4)
WBC: 8.2 10*3/uL (ref 3.4–10.8)

## 2017-10-22 LAB — TSH: TSH: 2.14 u[IU]/mL (ref 0.450–4.500)

## 2017-10-27 ENCOUNTER — Encounter: Payer: Self-pay | Admitting: Family Medicine

## 2017-11-05 ENCOUNTER — Encounter: Payer: Self-pay | Admitting: Family Medicine

## 2017-11-05 ENCOUNTER — Ambulatory Visit: Payer: BLUE CROSS/BLUE SHIELD | Admitting: Family Medicine

## 2017-11-05 VITALS — BP 126/68 | HR 51 | Temp 97.5°F | Ht 68.0 in | Wt 216.5 lb

## 2017-11-05 DIAGNOSIS — F3289 Other specified depressive episodes: Secondary | ICD-10-CM | POA: Diagnosis not present

## 2017-11-05 MED ORDER — ESCITALOPRAM OXALATE 20 MG PO TABS: 20.0000 mg | ORAL_TABLET | Freq: Every day | ORAL | 2 refills | Status: DC

## 2017-11-05 NOTE — Progress Notes (Signed)
Chief Complaint  Patient presents with  . Depression    HPI  Patient presents today for follow-up on his depression.  No formal PHQ score was obtained but we did informally go over symptoms related to his depression from his last score.  He still has some feeling sad and down almost every day.  He is sleeping somewhat better and he has no desire to hurt himself or anyone else.  He feels like his mood is a little bit better but not in remission.  He denies any side effects from the Lexapro.  PMH: Smoking status noted ROS: Per HPI  Objective: BP 126/68   Pulse (!) 51   Temp (!) 97.5 F (36.4 C) (Oral)   Ht 5\' 8"  (1.727 m)   Wt 216 lb 8 oz (98.2 kg)   BMI 32.92 kg/m   Gen: NAD, alert, cooperative with exam HEENT: NCAT, EOMI, PERRL CV: RRR, good S1/S2, no murmur Resp: CTABL, no wheezes, non-labored Abd: SNTND, BS present, no guarding or organomegaly Ext: No edema, warm Neuro: Alert and oriented, No gross deficits  Assessment and plan:  1. Other depression     Meds ordered this encounter  Medications  . escitalopram (LEXAPRO) 20 MG tablet    Sig: Take 1 tablet (20 mg total) by mouth at bedtime.    Dispense:  30 tablet    Refill:  2    No orders of the defined types were placed in this encounter.   Follow up in 1 month.  I encouraged him to seek counseling.  He still has the printout of counselors in the area that he was given last time he was here.  He agrees to call but admits he has not done that as yet. Mechele Claude, MD

## 2017-11-14 ENCOUNTER — Other Ambulatory Visit: Payer: Self-pay | Admitting: Family Medicine

## 2017-12-04 ENCOUNTER — Other Ambulatory Visit: Payer: Self-pay | Admitting: Family Medicine

## 2017-12-08 ENCOUNTER — Ambulatory Visit: Payer: BLUE CROSS/BLUE SHIELD | Admitting: Family Medicine

## 2017-12-08 ENCOUNTER — Encounter: Payer: Self-pay | Admitting: Family Medicine

## 2017-12-08 VITALS — BP 131/75 | HR 66 | Temp 97.8°F | Ht 68.0 in | Wt 217.0 lb

## 2017-12-08 DIAGNOSIS — F3289 Other specified depressive episodes: Secondary | ICD-10-CM

## 2017-12-08 NOTE — Progress Notes (Signed)
Chief Complaint  Patient presents with  . Depression    1 mo follow up     HPI  Patient presents today for recheck of depression.  Things are going much better for him to the Lexapro agrees with him.  He denies any side effects.  Particularly he has just a little bit of trouble waking up in the morning but he just sets his alarm clock a little louder and has an extra cup of coffee.  Depression screen [facility] 2/9 12/08/2017 11/05/2017 10/21/2017 04/21/2017 08/15/2016  Decreased Interest 1 1 3  0 0  Down, Depressed, Hopeless 1 2 3  0 0  PHQ - 2 Score 2 3 6  0 0  Altered sleeping 1 1 1  - -  Tired, decreased energy 1 1 2  - -  Change in appetite 1 2 2  - -  Feeling bad or failure about yourself  1 2 3  - -  Trouble concentrating 0 0 1 - -  Moving slowly or fidgety/restless 1 2 2  - -  Suicidal thoughts 0 0 3 - -  PHQ-9 Score 7 11 20  - -     PMH: Smoking status noted ROS: Per HPI  Objective: BP 131/75 (BP Location: Left Arm)   Pulse 66   Temp 97.8 F (36.6 C) (Oral)   Ht 5\' 8"  (1.727 m)   Wt 217 lb (98.4 kg)   BMI 32.99 kg/m  Gen: NAD, alert, cooperative with exam HEENT: NCAT, EOMI, PERRL CV: RRR, good S1/S2, no murmur Resp: CTABL, no wheezes, non-labored Abd: SNTND, BS present, no guarding or organomegaly Ext: No edema, warm Neuro: Alert and oriented, No gross deficits  Assessment and plan:  1. Other depression     No orders of the defined types were placed in this encounter.   No orders of the defined types were placed in this encounter.   Follow up as needed.  Mechele Claude, MD

## 2018-01-09 ENCOUNTER — Other Ambulatory Visit: Payer: Self-pay | Admitting: Family Medicine

## 2018-01-30 ENCOUNTER — Other Ambulatory Visit: Payer: Self-pay | Admitting: Family Medicine

## 2018-03-11 ENCOUNTER — Encounter: Payer: Self-pay | Admitting: Family Medicine

## 2018-03-11 ENCOUNTER — Ambulatory Visit: Payer: BLUE CROSS/BLUE SHIELD | Admitting: Family Medicine

## 2018-03-11 VITALS — BP 122/74 | HR 66 | Temp 98.8°F | Ht 68.0 in | Wt 218.0 lb

## 2018-03-11 DIAGNOSIS — F411 Generalized anxiety disorder: Secondary | ICD-10-CM

## 2018-03-11 DIAGNOSIS — F324 Major depressive disorder, single episode, in partial remission: Secondary | ICD-10-CM | POA: Diagnosis not present

## 2018-03-11 MED ORDER — ESCITALOPRAM OXALATE 20 MG PO TABS: 20.0000 mg | ORAL_TABLET | Freq: Every day | ORAL | 2 refills | Status: DC

## 2018-03-11 NOTE — Patient Instructions (Signed)
Your provider wants you to schedule an appointment with a Psychologist/Psychiatrist. The following list of offices requires the patient to call and make their own appointment, as there is information they need that only you can provide. Please feel free to choose form the following providers:  [facility]   [phone] Crisis Recovery in [place] [phone]  [facility]  [phone]   [address] [place], [place]  (Scheduled through [facility]) Must call and do an interview for appointment. Sees Children / Accepts Medicaid  [facility]    [phone]  [address], [address]    [place], [place]       [facility]  [phone] [address] [place], [place]  Evaluates for Autism but does not treat it Sees Children / Accepts Medicaid  [facility]    [phone] [address], [address]   [place], [place] Medication management, substance abuse, bipolar, grief, family, marriage, OCD, anxiety, PTSD Sees children / Accepts Medicaid  [facility]    [phone] [address] [place], [place] Sees children / Accepts Medicaid  [facility]  [phone] [address] [place], [place]   Dr Akinlayo     [phone] [address] [place], [place]  Sees ADD & ADHD for treatment Accepts Medicaid  [facility]  [phone] [address] [place], [place] Evaluates for Autism Accepts Medicaid   [facility]  [phone] [address]  [place], [place]  Does Adult ADD evaluations Does not accept Medicaid  [facility]   [phone] [address]   , [place] Uses animal therapy  Sees children as young as 3 years old Accepts Medicaid  [facility]     [phone]    [address]  [place], [place] [address] Sees children Accepts Medicaid  

## 2018-03-22 ENCOUNTER — Encounter: Payer: Self-pay | Admitting: Family Medicine

## 2018-03-22 NOTE — Progress Notes (Signed)
Subjective:  Patient ID: Juan Holloway, male    DOB: [birth year]  Age: 48 y.o. MRN: [id number]  CC: Depression (Depression is improving on Lexapro. Anxiety is still present. Has times where he has to step outside at work to calm down. Not usually provoked by anything in particular. )   HPI Juan Holloway presents for Improving depression and anxiety. Still bothered by anxiety, sense of nervousness and worry. Results below reviewed with pt.  [facility] 2[facility]9 03/11/2018 12/08/2017 11/05/2017  Decreased Interest 1 1 1   Down, Depressed, Hopeless 1 1 2   PHQ - 2 Score 2 2 3   Altered sleeping 0 1 1  Tired, decreased energy 1 1 1   Change in appetite 1 1 2   Feeling bad or failure about yourself  1 1 2   Trouble concentrating 0 0 0  Moving slowly or fidgety/restless 0 1 2  Suicidal thoughts 0 0 0  PHQ-9 Score 5 7 11   Difficult doing work/chores Somewhat difficult - -   GAD 7 : Generalized Anxiety Score 03/11/2018  Nervous, Anxious, on Edge 1  Control/stop worrying 1  Worry too much - different things 1  Trouble relaxing 0  Restless 0  Easily annoyed or irritable 1  Afraid - awful might happen 0  Total GAD 7 Score 4  Anxiety Difficulty Somewhat difficult      History Juan Holloway has a past medical history of Alcohol abuse, Chicken pox, Depression, Frequent headaches, GERD (gastroesophageal reflux disease), Knee pain, Left elbow pain, and Migraines.   He has a past surgical history that includes Cyst removal hand and Cystectomy.   His family history includes Alcoholism in his paternal grandmother; Arthritis in an other family member; Diabetes in his father, mother, paternal grandfather, and paternal grandmother; Hypertension in his brother, father, mother, and paternal grandmother; Mental illness in his mother.He reports that he has been smoking cigarettes. He has been smoking about 2.00 packs per day. He has never used smokeless tobacco. He reports current drug use. He reports that he  does not drink alcohol.    ROS Review of Systems  Constitutional: Negative for fever.  Respiratory: Negative for shortness of breath.   Cardiovascular: Negative for chest pain.  Musculoskeletal: Negative for arthralgias.  Skin: Negative for rash.    Objective:  BP 122/74 (BP Location: Left Arm, Patient Position: Sitting, Cuff Size: Normal)   Pulse 66   Temp 98.8 F (37.1 C) (Oral)   Ht 5\' 8"  (1.727 m)   Wt 218 lb (98.9 kg)   BMI 33.15 kg/m   BP Readings from Last 3 Encounters:  03/11/18 122/74  12/08/17 131/75  11/05/17 126/68    Wt Readings from Last 3 Encounters:  03/11/18 218 lb (98.9 kg)  12/08/17 217 lb (98.4 kg)  11/05/17 216 lb 8 oz (98.2 kg)     Physical Exam Vitals signs reviewed.  Constitutional:      Appearance: He is well-developed.  HENT:     Head: Normocephalic and atraumatic.     Right Ear: External ear normal.     Left Ear: External ear normal.     Mouth/Throat:     Pharynx: No oropharyngeal exudate or posterior oropharyngeal erythema.  Eyes:     Pupils: Pupils are equal, round, and reactive to light.  Neck:     Musculoskeletal: Normal range of motion and neck supple.  Cardiovascular:     Rate and Rhythm: Normal rate and regular rhythm.     Heart sounds: No murmur.  Pulmonary:  Effort: No respiratory distress.     Breath sounds: Normal breath sounds.  Neurological:     Mental Status: He is alert and oriented to person, place, and time.       Assessment & Plan:   Juan Holloway was seen today for depression.  Diagnoses and all orders for this visit:  Major depressive disorder with single episode, in partial remission (HCC)  Anxiety state  Other orders -     escitalopram (LEXAPRO) 20 MG tablet; Take 1 tablet (20 mg total) by mouth daily.       I have changed Juan Holloway's escitalopram. I am also having him maintain his diclofenac and pantoprazole.  Allergies as of 03/11/2018   No Known Allergies     Medication List         Accurate as of March 11, 2018 11:59 PM. Always use your most recent med list.        diclofenac 75 MG EC tablet Commonly known as:  VOLTAREN TAKE 1 TABLET (75 MG TOTAL) BY MOUTH 2 (TWO) TIMES DAILY. FOR MUSCLE AND JOINT PAIN   escitalopram 20 MG tablet Commonly known as:  LEXAPRO Take 1 tablet (20 mg total) by mouth daily.   pantoprazole 20 MG tablet Commonly known as:  PROTONIX TAKE 1 TABLET BY MOUTH EVERY DAY        Follow-up: Return in about 3 months (around 06/10/2018) for depression f/u with Alyene Predmore.  Mechele ClaudeWarren Zehava Turski, M.D.

## 2018-06-10 ENCOUNTER — Encounter: Payer: Self-pay | Admitting: Family Medicine

## 2018-06-10 ENCOUNTER — Other Ambulatory Visit: Payer: Self-pay

## 2018-06-10 ENCOUNTER — Ambulatory Visit (INDEPENDENT_AMBULATORY_CARE_PROVIDER_SITE_OTHER): Payer: BLUE CROSS/BLUE SHIELD | Admitting: Family Medicine

## 2018-06-10 DIAGNOSIS — M25562 Pain in left knee: Secondary | ICD-10-CM | POA: Diagnosis not present

## 2018-06-10 DIAGNOSIS — G8929 Other chronic pain: Secondary | ICD-10-CM

## 2018-06-10 DIAGNOSIS — F324 Major depressive disorder, single episode, in partial remission: Secondary | ICD-10-CM

## 2018-06-10 DIAGNOSIS — K219 Gastro-esophageal reflux disease without esophagitis: Secondary | ICD-10-CM

## 2018-06-10 MED ORDER — DULOXETINE HCL 60 MG PO CPEP: 60.0000 mg | ORAL_CAPSULE | Freq: Every day | ORAL | 2 refills | Status: DC

## 2018-06-10 MED ORDER — PANTOPRAZOLE SODIUM 20 MG PO TBEC: 20.0000 mg | DELAYED_RELEASE_TABLET | Freq: Every day | ORAL | 5 refills | Status: DC

## 2018-06-10 MED ORDER — DICLOFENAC SODIUM 75 MG PO TBEC: 75.0000 mg | DELAYED_RELEASE_TABLET | Freq: Two times a day (BID) | ORAL | 2 refills | Status: DC

## 2018-06-10 NOTE — Progress Notes (Signed)
Subjective:  Patient ID: Juan Holloway, male    DOB: [birth year]  Age: 48 y.o. MRN: [id number]  CC: No chief complaint on file.   HPI Juan Holloway presents for depression. Had a few rough days a couple months ago. Missed a week of work in Feb. Bounced back. Not 100%. About 50%.Says he has [place] in two states for a problem he is unwilling to mention, but that history causes him to feel depressed. He states that if he told me he wouldn't be comfortable with me being his doctor.   Reflux is stable as long as he takes PPI. No complaints of heartburn, indigestion. No change in bowels, melena or hematochezia.  Knee and back pain are doing well, not interfering with ambulation, etc. Using diclofenac daily.   Depression screen [facility] 2/9 03/11/2018 12/08/2017 11/05/2017  Decreased Interest 1 1 1   Down, Depressed, Hopeless 1 1 2   PHQ - 2 Score 2 2 3   Altered sleeping 0 1 1  Tired, decreased energy 1 1 1   Change in appetite 1 1 2   Feeling bad or failure about yourself  1 1 2   Trouble concentrating 0 0 0  Moving slowly or fidgety/restless 0 1 2  Suicidal thoughts 0 0 0  PHQ-9 Score 5 7 11   Difficult doing work/chores Somewhat difficult - -    History Juan Holloway has a past medical history of Alcohol abuse, Chicken pox, Depression, Frequent headaches, GERD (gastroesophageal reflux disease), Knee pain, Left elbow pain, and Migraines.   He has a past surgical history that includes Cyst removal hand and Cystectomy.   His family history includes Alcoholism in his paternal grandmother; Arthritis in an other family member; Diabetes in his father, mother, paternal grandfather, and paternal grandmother; Hypertension in his brother, father, mother, and paternal grandmother; Mental illness in his mother.He reports that he has been smoking cigarettes. He has been smoking about 2.00 packs per day. He has never used smokeless tobacco. He reports current drug use. He reports that he does not drink alcohol.    ROS Review of Systems  Constitutional: Negative.   HENT: Negative.   Eyes: Negative for visual disturbance.  Respiratory: Negative for cough and shortness of breath.   Cardiovascular: Negative for chest pain and leg swelling.  Gastrointestinal: Negative for abdominal pain, diarrhea, nausea and vomiting.  Genitourinary: Negative for difficulty urinating.  Musculoskeletal: Positive for arthralgias (controlled taking the diclofenac once a day). Negative for myalgias.  Skin: Negative for rash.  Neurological: Negative for headaches.  Psychiatric/Behavioral: Negative for sleep disturbance.    Objective:  There were no vitals taken for this visit.  BP Readings from Last 3 Encounters:  03/11/18 122/74  12/08/17 131/75  11/05/17 126/68    Wt Readings from Last 3 Encounters:  03/11/18 218 lb (98.9 kg)  12/08/17 217 lb (98.4 kg)  11/05/17 216 lb 8 oz (98.2 kg)     Physical Exam Deferred - phone visit   Assessment & Plan:   Diagnoses and all orders for this visit:  Major depressive disorder with single episode, in partial remission (HCC)  Other orders -     DULoxetine (CYMBALTA) 60 MG capsule; Take 1 capsule (60 mg total) by mouth daily. Take with a full meal, usually supper -     pantoprazole (PROTONIX) 20 MG tablet; Take 1 tablet (20 mg total) by mouth daily. -     diclofenac (VOLTAREN) 75 MG EC tablet; Take 1 tablet (75 mg total) by mouth 2 (two) times daily.  For muscle and  Joint pain       I have discontinued Fermin Schwabavid Lawry's escitalopram. I have also changed his pantoprazole and diclofenac. Additionally, I am having him start on DULoxetine.  Allergies as of 06/10/2018   No Known Allergies     Medication List       Accurate as of June 10, 2018  4:48 PM. Always use your most recent med list.        diclofenac 75 MG EC tablet Commonly known as:  VOLTAREN Take 1 tablet (75 mg total) by mouth 2 (two) times daily. For muscle and  Joint pain   DULoxetine 60 MG  capsule Commonly known as:  Cymbalta Take 1 capsule (60 mg total) by mouth daily. Take with a full meal, usually supper   pantoprazole 20 MG tablet Commonly known as:  PROTONIX Take 1 tablet (20 mg total) by mouth daily.     Virtual Visit via telephone Note  I discussed the limitations, risks, security and privacy concerns of performing an evaluation and management service by telephone and the availability of in person appointments. I also discussed with the patient that there may be a patient responsible charge related to this service. The patient expressed understanding and agreed to proceed. Pt. Is at home. Dr. Darlyn ReadStacks is in his office.  Follow Up Instructions:   I discussed the assessment and treatment plan with the patient. The patient was provided an opportunity to ask questions and all were answered. The patient agreed with the plan and demonstrated an understanding of the instructions.   The patient was advised to call back or seek an in-person evaluation if the symptoms worsen or if the condition fails to improve as anticipated.  Visit started: 4:25 Call ended:  4:44 Total minutes including chart review and phone contact time: 19    Follow-up: Return in about 3 months (around 09/09/2018).  Mechele ClaudeWarren Sonia Stickels, M.D.

## 2018-06-17 IMAGING — DX DG FOOT COMPLETE 3+V*R*
3 series · 3 of 3 positions shown · non-contrast
Comparison: None.

CLINICAL DATA: Right great toe pain.

EXAM:
RIGHT FOOT COMPLETE - 3+ VIEW

[foot ap]
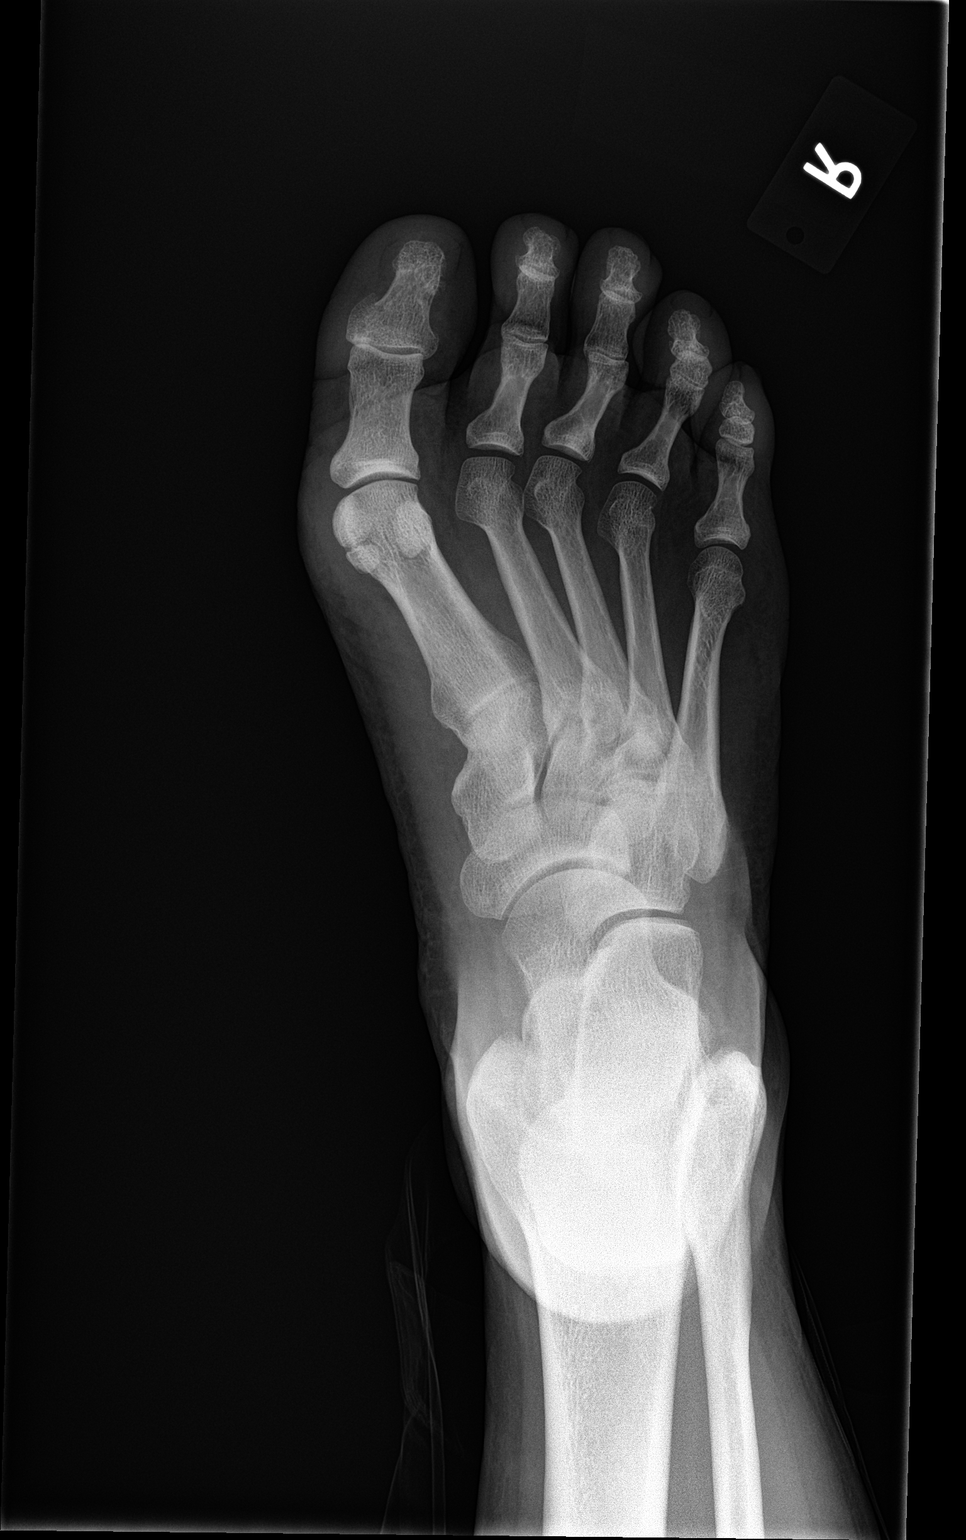

[foot obl]
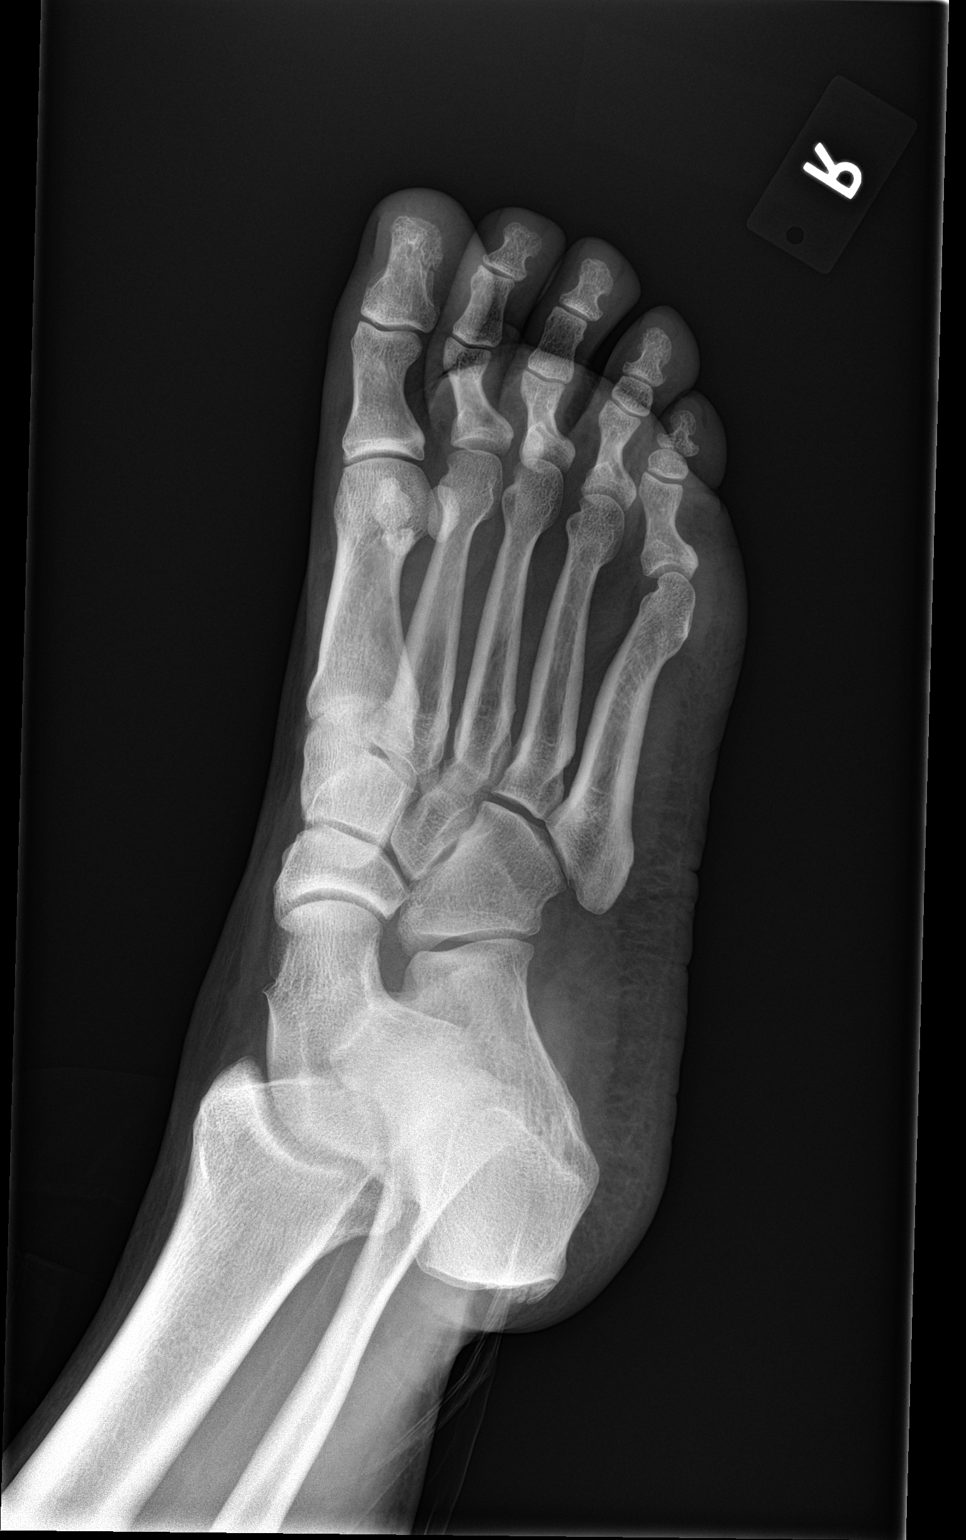

[foot lat]
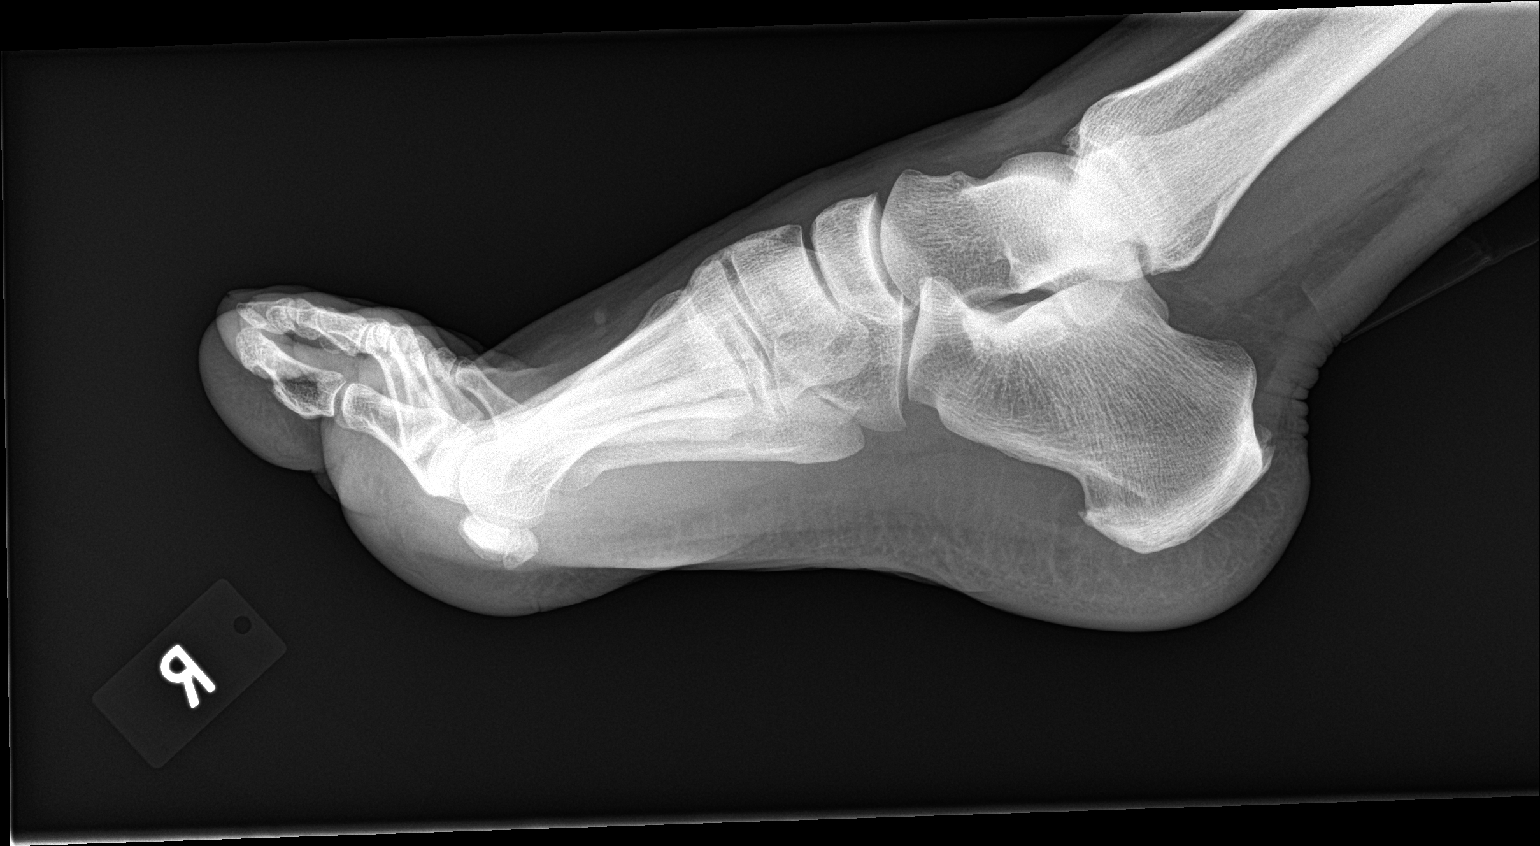

[3 of 3 positions shown; findings below may reference images not displayed]

FINDINGS: There is no evidence of fracture or dislocation. There is no
evidence of arthropathy or other focal bone abnormality. Soft
tissues are unremarkable.
IMPRESSION: Negative.

## 2018-08-21 ENCOUNTER — Encounter: Payer: Self-pay | Admitting: Family Medicine

## 2018-08-21 ENCOUNTER — Other Ambulatory Visit: Payer: Self-pay

## 2018-08-21 ENCOUNTER — Ambulatory Visit (INDEPENDENT_AMBULATORY_CARE_PROVIDER_SITE_OTHER): Payer: BC Managed Care – PPO | Admitting: Family Medicine

## 2018-08-21 DIAGNOSIS — F321 Major depressive disorder, single episode, moderate: Secondary | ICD-10-CM | POA: Diagnosis not present

## 2018-08-21 MED ORDER — DULOXETINE HCL 60 MG PO CPEP: 60.0000 mg | ORAL_CAPSULE | Freq: Two times a day (BID) | ORAL | 2 refills | Status: DC
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
## 2018-10-19 DIAGNOSIS — K649 Unspecified hemorrhoids: Secondary | ICD-10-CM | POA: Diagnosis not present

## 2018-10-19 DIAGNOSIS — Z6835 Body mass index (BMI) 35.0-35.9, adult: Secondary | ICD-10-CM | POA: Diagnosis not present

## 2018-10-19 DIAGNOSIS — R1012 Left upper quadrant pain: Secondary | ICD-10-CM | POA: Diagnosis not present

## 2018-10-20 ENCOUNTER — Ambulatory Visit: Payer: BC Managed Care – PPO | Admitting: Family Medicine

## 2018-10-21 ENCOUNTER — Ambulatory Visit: Payer: BC Managed Care – PPO | Admitting: Family Medicine

## 2018-10-21 ENCOUNTER — Other Ambulatory Visit: Payer: Self-pay

## 2018-10-21 ENCOUNTER — Encounter: Payer: Self-pay | Admitting: Family Medicine

## 2018-10-21 VITALS — BP 135/86 | HR 91 | Temp 98.4°F | Ht 68.0 in | Wt 224.0 lb

## 2018-10-21 DIAGNOSIS — F411 Generalized anxiety disorder: Secondary | ICD-10-CM | POA: Diagnosis not present

## 2018-10-21 DIAGNOSIS — F331 Major depressive disorder, recurrent, moderate: Secondary | ICD-10-CM

## 2018-10-21 MED ORDER — BUPROPION HCL ER (XL) 300 MG PO TB24: 300.0000 mg | ORAL_TABLET | Freq: Every day | ORAL | 1 refills | Status: DC

## 2018-10-21 MED ORDER — DULOXETINE HCL 60 MG PO CPEP: 60.0000 mg | ORAL_CAPSULE | Freq: Two times a day (BID) | ORAL | 2 refills | Status: DC

## 2018-10-21 MED ORDER — DICLOFENAC SODIUM 75 MG PO TBEC: 75.0000 mg | DELAYED_RELEASE_TABLET | Freq: Two times a day (BID) | ORAL | 2 refills | Status: DC

## 2018-10-21 MED ORDER — PANTOPRAZOLE SODIUM 20 MG PO TBEC: 20.0000 mg | DELAYED_RELEASE_TABLET | Freq: Every day | ORAL | 2 refills | Status: AC
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
## 2018-11-04 ENCOUNTER — Encounter: Payer: Self-pay | Admitting: Family Medicine

## 2018-11-04 ENCOUNTER — Ambulatory Visit (INDEPENDENT_AMBULATORY_CARE_PROVIDER_SITE_OTHER): Payer: BC Managed Care – PPO | Admitting: Family Medicine

## 2018-11-04 DIAGNOSIS — T50905A Adverse effect of unspecified drugs, medicaments and biological substances, initial encounter: Secondary | ICD-10-CM | POA: Diagnosis not present

## 2018-11-04 DIAGNOSIS — F331 Major depressive disorder, recurrent, moderate: Secondary | ICD-10-CM

## 2018-11-04 MED ORDER — SERTRALINE HCL 50 MG PO TABS: 50.0000 mg | ORAL_TABLET | Freq: Every day | ORAL | 5 refills | Status: DC

## 2018-11-04 NOTE — Progress Notes (Signed)
Subjective:    Patient ID: Juan Holloway, male    DOB: [birth year], 48 y.o.   MRN: [id number]   HPI: Juan Holloway is a 48 y.o. male presenting for stomach being torn up by duloxetine & wellbutrin. Stopped both meds and chest, abd pain went away.   Depression not as bad as it was. Felt depression was driving the stomach pain, but it was not because sx went away when med DCed.    Depression screen [facility] 2/9 10/21/2018 03/11/2018 12/08/2017 11/05/2017 10/21/2017  Decreased Interest 0 1 1 1 3   Down, Depressed, Hopeless 2 1 1 2 3   PHQ - 2 Score 2 2 2 3 6   Altered sleeping 0 0 1 1 1   Tired, decreased energy 3 1 1 1 2   Change in appetite 2 1 1 2 2   Feeling bad or failure about yourself  2 1 1 2 3   Trouble concentrating 0 0 0 0 1  Moving slowly or fidgety/restless 0 0 1 2 2   Suicidal thoughts 0 0 0 0 3  PHQ-9 Score 9 5 7 11 20   Difficult doing work/chores - Somewhat difficult - - -     Relevant past medical, surgical, family and social history reviewed and updated as indicated.  Interim medical history since our last visit reviewed. Allergies and medications reviewed and updated.  ROS:  Review of Systems  Constitutional: Negative for fever.  Respiratory: Negative for shortness of breath.   Cardiovascular: Positive for chest pain (DCed after stopping cymbalta).  Gastrointestinal: Positive for abdominal pain (resolved after DC of cymbalta).  Musculoskeletal: Negative for arthralgias.  Skin: Negative for rash.     Social History   Tobacco Use  Smoking Status Current Every Day Smoker  . Packs/day: 2.00  . Types: Cigarettes  Smokeless Tobacco Never Used       Objective:     Wt Readings from Last 3 Encounters:  10/21/18 224 lb (101.6 kg)  03/11/18 218 lb (98.9 kg)  12/08/17 217 lb (98.4 kg)     Exam deferred. Pt. Harboring due to COVID 19. Phone visit performed.   Assessment & Plan:   1. Moderate episode of recurrent major depressive disorder ([place])   2. Medication side  effect, initial encounter     Meds ordered this encounter  Medications  . sertraline (ZOLOFT) 50 MG tablet    Sig: Take 1 tablet (50 mg total) by mouth at bedtime. For anxiety and depression    Dispense:  30 tablet    Refill:  5    No orders of the defined types were placed in this encounter.     Diagnoses and all orders for this visit:  Moderate episode of recurrent major depressive disorder (HCC)  Medication side effect, initial encounter  Other orders -     sertraline (ZOLOFT) 50 MG tablet; Take 1 tablet (50 mg total) by mouth at bedtime. For anxiety and depression    Virtual Visit via telephone Note  I discussed the limitations, risks, security and privacy concerns of performing an evaluation and management service by telephone and the availability of in person appointments. The patient was identified with two identifiers. Juan Hollowayexpressed understanding and agreed to proceed. Pt. Is at home. Dr. Livia Snellen is in his office.  Follow Up Instructions:   I discussed the assessment and treatment plan with the patient. The patient was provided an opportunity to ask questions and all were answered. The patient agreed with the plan and demonstrated an understanding of the  instructions.   The patient was advised to call back or seek an in-person evaluation if the symptoms worsen or if the condition fails to improve as anticipated.   Total minutes including chart review and phone contact time: 20   Follow up plan: Return in about 2 weeks (around 11/18/2018) for Depression.  Mechele ClaudeWarren Tinzlee Craker, MD [facility]

## 2018-11-05 ENCOUNTER — Telehealth: Payer: Self-pay | Admitting: Family Medicine

## 2018-11-06 ENCOUNTER — Other Ambulatory Visit: Payer: Self-pay

## 2018-11-06 NOTE — Telephone Encounter (Signed)
Patient came to office for note.  Gave him work note for 11/04/2018 through 11/06/2018.

## 2018-12-23 ENCOUNTER — Encounter: Payer: Self-pay | Admitting: Family Medicine

## 2018-12-23 ENCOUNTER — Other Ambulatory Visit: Payer: Self-pay

## 2018-12-23 ENCOUNTER — Ambulatory Visit (INDEPENDENT_AMBULATORY_CARE_PROVIDER_SITE_OTHER): Payer: BC Managed Care – PPO | Admitting: Family Medicine

## 2018-12-23 VITALS — BP 117/69 | HR 71 | Temp 96.0°F | Resp 20 | Ht 68.0 in | Wt 223.0 lb

## 2018-12-23 DIAGNOSIS — K648 Other hemorrhoids: Secondary | ICD-10-CM | POA: Diagnosis not present

## 2018-12-23 MED ORDER — HYDROCORTISONE ACETATE 25 MG RE SUPP: 25.0000 mg | Freq: Four times a day (QID) | RECTAL | 2 refills | Status: AC | PRN

## 2018-12-23 NOTE — Progress Notes (Signed)
Chief Complaint  Patient presents with  . Hemorrhoids    HPI  Patient presents today for Rectal pain and bleeding for several days. Increasing over time.   PMH: Smoking status noted ROS: Per HPI  Objective: BP 117/69   Pulse 71   Temp (!) 96 F (35.6 C) (Temporal)   Resp 20   Ht 5\' 8"  (1.727 m)   Wt 223 lb (101.2 kg)   SpO2 93%   BMI 33.91 kg/m  Gen: NAD, alert, cooperative with exam HEENT: NCAT, EOMI, PERRL CV: RRR, good S1/S2, no murmur Resp: CTABL, no wheezes, non-labored Abd: SNTND, BS present, no guarding or organomegaly Rectal, no exernal lesion. Two grade two internal hemorrhoids palpable via digital exam Neuro: Alert and oriented, No gross deficits  Assessment and plan:  1. Inflamed internal hemorrhoid     Meds ordered this encounter  Medications  . hydrocortisone (ANUSOL-HC) 25 MG suppository    Sig: Place 1 suppository (25 mg total) rectally 4 (four) times daily as needed for hemorrhoids or anal itching.    Dispense:  48 suppository    Refill:  2    No orders of the defined types were placed in this encounter.   Follow up as needed.  Claretta Fraise, MD

## 2019-02-01 ENCOUNTER — Encounter: Payer: Self-pay | Admitting: Family Medicine

## 2019-02-01 ENCOUNTER — Telehealth: Payer: Self-pay

## 2019-02-01 ENCOUNTER — Ambulatory Visit (INDEPENDENT_AMBULATORY_CARE_PROVIDER_SITE_OTHER): Payer: 59 | Admitting: Family Medicine

## 2019-02-01 DIAGNOSIS — F331 Major depressive disorder, recurrent, moderate: Secondary | ICD-10-CM

## 2019-02-01 DIAGNOSIS — K591 Functional diarrhea: Secondary | ICD-10-CM

## 2019-02-01 MED ORDER — MIRTAZAPINE 15 MG PO TABS: 15.0000 mg | ORAL_TABLET | Freq: Every day | ORAL | 5 refills | Status: DC

## 2019-02-01 NOTE — Telephone Encounter (Signed)
Patient had a tele visit with you this morning.  Wife wants to pick up his note for him but there isn't a note in the chart yet.  Please advise.

## 2019-02-01 NOTE — Progress Notes (Signed)
Subjective:    Patient ID: Juan Holloway, male    DOB: [birth year], 48 y.o.   MRN: [id number]   HPI: Itzael Liptak is a 48 y.o. male presenting for diarrhea for three weeks with use of sertraline. Had switched to it due to chest pain from previous medications. Has been out of work since 11/13 as a result. Stopped taking it 5 days ago.  Diarrhea has DCed. Now ready to return to work. Needs a note to return. Still depresssed, sad, withdrawn. Difficulty concentrating. Symptoms worsening since stopping sertraline.    Depression screen [facility] 2/9 12/23/2018 10/21/2018 03/11/2018 12/08/2017 11/05/2017  Decreased Interest 0 0 1 1 1   Down, Depressed, Hopeless 0 2 1 1 2   PHQ - 2 Score 0 2 2 2 3   Altered sleeping - 0 0 1 1  Tired, decreased energy - 3 1 1 1   Change in appetite - 2 1 1 2   Feeling bad or failure about yourself  - 2 1 1 2   Trouble concentrating - 0 0 0 0  Moving slowly or fidgety/restless - 0 0 1 2  Suicidal thoughts - 0 0 0 0  PHQ-9 Score - 9 5 7 11   Difficult doing work/chores - - Somewhat difficult - -     Relevant past medical, surgical, family and social history reviewed and updated as indicated.  Interim medical history since our last visit reviewed. Allergies and medications reviewed and updated.  ROS:  Review of Systems  Constitutional: Negative for fever.  Respiratory: Negative for shortness of breath.   Cardiovascular: Negative for chest pain.  Musculoskeletal: Negative for arthralgias.  Skin: Negative for rash.     Social History   Tobacco Use  Smoking Status Current Every Day Smoker  . Packs/day: 2.00  . Types: Cigarettes  Smokeless Tobacco Never Used       Objective:     Wt Readings from Last 3 Encounters:  12/23/18 223 lb (101.2 kg)  10/21/18 224 lb (101.6 kg)  03/11/18 218 lb (98.9 kg)     Exam deferred. Pt. Harboring due to COVID 19. Phone visit performed.   Assessment & Plan:   1. Moderate episode of recurrent major depressive disorder  (HCC)   2. Functional diarrhea     Meds ordered this encounter  Medications  . mirtazapine (REMERON) 15 MG tablet    Sig: Take 1 tablet (15 mg total) by mouth at bedtime. For depression    Dispense:  30 tablet    Refill:  5    No orders of the defined types were placed in this encounter.     Diagnoses and all orders for this visit:  Moderate episode of recurrent major depressive disorder (HCC)  Functional diarrhea  Other orders -     mirtazapine (REMERON) 15 MG tablet; Take 1 tablet (15 mg total) by mouth at bedtime. For depression    Virtual Visit via telephone Note  I discussed the limitations, risks, security and privacy concerns of performing an evaluation and management service by telephone and the availability of in person appointments. The patient was identified with two identifiers. Pt.expressed understanding and agreed to proceed. Pt. Is at home. Dr. is in his office.  Follow Up Instructions:   I discussed the assessment and treatment plan with the patient. The patient was provided an opportunity to ask questions and all were answered. The patient agreed with the plan and demonstrated an understanding of the instructions.   The patient was advised to  call back or seek an in-person evaluation if the symptoms worsen or if the condition fails to improve as anticipated.   Total minutes including chart review and phone contact time: 23   Follow up plan: Return in about 1 month (around 03/04/2019).  Claretta Fraise, MD [place]

## 2019-02-01 NOTE — Telephone Encounter (Signed)
Printed and placed up front, wife aware

## 2019-02-01 NOTE — Telephone Encounter (Signed)
Please contact the patient Should be there now. Thanks

## 2020-03-09 ENCOUNTER — Emergency Department (HOSPITAL_COMMUNITY)
Admission: EM | Admit: 2020-03-09 | Discharge: 2020-03-09 | Disposition: A | Discharge status: Home / Self Care | Payer: Self-pay | Attending: Emergency Medicine | Admitting: Emergency Medicine

## 2020-03-09 ENCOUNTER — Encounter (HOSPITAL_COMMUNITY): Payer: Self-pay | Admitting: Emergency Medicine

## 2020-03-09 ENCOUNTER — Other Ambulatory Visit: Payer: Self-pay

## 2020-03-09 ENCOUNTER — Emergency Department (HOSPITAL_COMMUNITY): Payer: Self-pay

## 2020-03-09 DIAGNOSIS — R030 Elevated blood-pressure reading, without diagnosis of hypertension: Secondary | ICD-10-CM | POA: Insufficient documentation

## 2020-03-09 DIAGNOSIS — F17210 Nicotine dependence, cigarettes, uncomplicated: Secondary | ICD-10-CM | POA: Insufficient documentation

## 2020-03-09 DIAGNOSIS — M5442 Lumbago with sciatica, left side: Secondary | ICD-10-CM | POA: Insufficient documentation

## 2020-03-09 MED ORDER — NAPROXEN 375 MG PO TABS: 375.0000 mg | ORAL_TABLET | Freq: Two times a day (BID) | ORAL | 0 refills | Status: AC

## 2020-03-09 MED ORDER — METHOCARBAMOL 500 MG PO TABS: 500.0000 mg | ORAL_TABLET | Freq: Two times a day (BID) | ORAL | 0 refills | Status: AC

## 2020-03-09 MED ORDER — OXYCODONE-ACETAMINOPHEN 5-325 MG PO TABS
1.0000 | ORAL_TABLET | ORAL | Status: DC | PRN
  Administered 2020-03-09: 1 via ORAL
  Filled 2020-03-09: qty 1

## 2020-03-09 MED ORDER — PREDNISONE 10 MG PO TABS: 40.0000 mg | ORAL_TABLET | Freq: Every day | ORAL | 0 refills | Status: AC

## 2020-03-09 MED ORDER — LIDOCAINE 5 % EX PTCH: 1.0000 | MEDICATED_PATCH | CUTANEOUS | 0 refills | Status: AC

## 2020-03-09 MED ORDER — OXYCODONE-ACETAMINOPHEN 5-325 MG PO TABS
1.0000 | ORAL_TABLET | Freq: Once | ORAL | Status: AC
  Administered 2020-03-09: 1 via ORAL
  Filled 2020-03-09: qty 1

## 2020-03-09 NOTE — ED Provider Notes (Signed)
[facility] EMERGENCY DEPARTMENT Provider Note   CSN: [id number] Arrival date & time: 03/09/20  1150     History Chief Complaint  Patient presents with  . Back Pain    Juan Holloway is a 50 y.o. male presents today for low back pain onset 3 days ago.  He reports Monday morning he got out of bed he felt a sudden pop to his lower back he reports he has had a constant aching pain to his bilateral lower back no alleviating factors worsened with movement, reports pain will occasionally radiate down his left leg.  Patient reports he has a history of low back pain and was last treated by his primary care provider around 1-2 years ago for the same.  He reports that improved with steroids.  He reports that he has never had any imaging of his lower back before.  Denies fall/injury, abdominal pain, nausea/vomiting, dysuria/hematuria, testicular pain/swelling, saddle paresthesias, bowel/bladder incontinence, urinary retention, history of IV drug use, history of cancer or any additional concerns.  Of note patient reports that he no longer takes any daily medications. HPI     Past Medical History:  Diagnosis Date  . Alcohol abuse   . Chicken pox   . Depression   . Frequent headaches   . GERD (gastroesophageal reflux disease)   . Knee pain   . Left elbow pain   . Migraines     Patient Active Problem List   Diagnosis Date Noted  . Knee pain, chronic 08/23/2013  . Depression 08/23/2013  . Anxiety state 08/23/2013    Past Surgical History:  Procedure Laterality Date  . CYST REMOVAL HAND    . CYSTECTOMY         Family History  Problem Relation Age of Onset  . Diabetes Father   . Hypertension Father   . Diabetes Mother   . Hypertension Mother   . Mental illness Mother   . Diabetes Paternal Grandmother   . Alcoholism Paternal Grandmother   . Hypertension Paternal Grandmother   . Diabetes Paternal Grandfather   . Arthritis Other        paternal grandparents   . Hypertension Brother     Social History   Tobacco Use  . Smoking status: Current Every Day Smoker    Packs/day: 2.00    Types: Cigarettes  . Smokeless tobacco: Never Used  Substance Use Topics  . Alcohol use: No  . Drug use: Yes    Comment: per pt weed every now and then     Home Medications Prior to Admission medications   Medication Sig Start Date End Date Taking? Authorizing Provider  lidocaine (LIDODERM) 5 % Place 1 patch onto the skin daily. Remove & Discard patch within 12 hours or as directed by MD 03/09/20  Yes Harlene Salts A, PA-C  methocarbamol (ROBAXIN) 500 MG tablet Take 1 tablet (500 mg total) by mouth 2 (two) times daily for 5 days. 03/09/20 03/14/20 Yes Harlene Salts A, PA-C  naproxen (NAPROSYN) 375 MG tablet Take 1 tablet (375 mg total) by mouth 2 (two) times daily for 5 days. 03/09/20 03/14/20 Yes Harlene Salts A, PA-C  predniSONE (DELTASONE) 10 MG tablet Take 4 tablets (40 mg total) by mouth daily for 5 days. 03/09/20 03/14/20 Yes Harlene Salts A, PA-C  hydrocortisone (ANUSOL-HC) 25 MG suppository Place 1 suppository (25 mg total) rectally 4 (four) times daily as needed for hemorrhoids or anal itching. 12/23/18   Mechele Claude, MD  pantoprazole (PROTONIX) 20  MG tablet Take 1 tablet (20 mg total) by mouth daily. 10/21/18   Mechele Claude, MD  mirtazapine (REMERON) 15 MG tablet Take 1 tablet (15 mg total) by mouth at bedtime. For depression 02/01/19 03/09/20  Mechele Claude, MD  sertraline (ZOLOFT) 50 MG tablet Take 1 tablet (50 mg total) by mouth at bedtime. For anxiety and depression 11/04/18 03/09/20  Mechele Claude, MD    Allergies    Patient has no known allergies.  Review of Systems   Review of Systems  Constitutional: Negative.  Negative for chills and fever.  Gastrointestinal: Negative.  Negative for abdominal pain, diarrhea, nausea and vomiting.  Genitourinary: Negative.  Negative for dysuria, hematuria, scrotal swelling and testicular pain.   Musculoskeletal: Positive for back pain. Negative for neck pain.  Neurological: Negative.  Negative for weakness and numbness.       Denies saddle area paresthesias. Denies bowel/bladder incontinence. Denies urinary retention.    Physical Exam Updated Vital Signs BP (!) 145/87 (BP Location: Left Arm)   Pulse 89   Temp 98.7 F (37.1 C) (Oral)   Resp 20   SpO2 96%   Physical Exam Constitutional:      General: He is not in acute distress.    Appearance: Normal appearance. He is well-developed. He is not ill-appearing or diaphoretic.  HENT:     Head: Normocephalic and atraumatic.  Eyes:     General: Vision grossly intact. Gaze aligned appropriately.     Pupils: Pupils are equal, round, and reactive to light.  Neck:     Trachea: Trachea and phonation normal.  Cardiovascular:     Pulses:          Dorsalis pedis pulses are 2+ on the right side and 2+ on the left side.  Pulmonary:     Effort: Pulmonary effort is normal. No respiratory distress.  Abdominal:     General: Abdomen is protuberant. There is no distension.     Palpations: Abdomen is soft. There is no pulsatile mass.     Tenderness: There is no abdominal tenderness. There is no guarding or rebound.  Musculoskeletal:        General: Normal range of motion.     Cervical back: Normal range of motion.       Back:     Comments: No midline C/T/L spinal tenderness to palpation, no deformity, crepitus, or step-off noted. No sign of injury to the neck or back. - Paraspinal lumbar muscular tenderness to palpation without overlying skin change, right worse than left.  Feet:     Right foot:     Protective Sensation: 3 sites tested. 3 sites sensed.     Skin integrity: Skin integrity normal.     Left foot:     Protective Sensation: 3 sites tested. 3 sites sensed.     Skin integrity: Skin integrity normal.  Skin:    General: Skin is warm and dry.  Neurological:     Mental Status: He is alert.     GCS: GCS eye subscore is 4.  GCS verbal subscore is 5. GCS motor subscore is 6.     Comments: Speech is clear and goal oriented, follows commands Major Cranial nerves without deficit, no facial droop Normal strength in upper and lower extremities bilaterally including dorsiflexion and plantar flexion, strong and equal grip strength Sensation normal to light and sharp touch Moves extremities without ataxia, coordination intact Normal gait DTR 2+ bilateral patella, no clonus in the feet  Psychiatric:  Behavior: Behavior normal.     ED Results / Procedures / Treatments   Labs (all labs ordered are listed, but only abnormal results are displayed) Labs Reviewed - No data to display  EKG None  Radiology DG Lumbar Spine Complete  Result Date: 03/09/2020 CLINICAL DATA:  Acute low back pain. EXAM: LUMBAR SPINE - COMPLETE 4+ VIEW COMPARISON:  None. FINDINGS: No fracture or spondylolisthesis is noted. Mild anterior osteophyte formation is noted at L2-3 and L3-4. Disc spaces appear to be well maintained. IMPRESSION: Mild degenerative changes are noted.  No acute abnormality is noted. Electronically Signed   By: Lupita Raider M.D.   On: 03/09/2020 14:41    Procedures Procedures (including critical care time)  Medications Ordered in ED Medications  oxyCODONE-acetaminophen (PERCOCET/ROXICET) 5-325 MG per tablet 1 tablet (has no administration in time range)    ED Course  I have reviewed the triage vital signs and the nursing notes.  Pertinent labs & imaging results that were available during my care of the patient were reviewed by me and considered in my medical decision making (see chart for details).  Clinical Course as of 03/09/20 1538  Thu Mar 09, 2020  1442 DDD [BM]    Clinical Course User Index [BM] Elizabeth Palau   MDM Rules/Calculators/A&P                         Additional history obtained from: 1. Nursing notes from this visit. 2. Review of electronic medical records.  Patient seen  at office visit with his PCP 04/21/2017 diagnosis acute right-sided low back pain without sciatica.  He was treated with 80 mg Solu-Medrol injection, prednisone burst, Percocet and Tizanidine. ------------- Juan Holloway is a 50 y.o. male presenting with low back pain right worse than left onset 3 days ago.  He reports that this is his typical low back pain his last flareup was around 3 years ago. Patient denies history of Trauma, fever, IV drug use, night sweats, weight loss, cancer, saddle anesthesia, urinary rentention, bowel/bladder incontinence. No neurological deficits and normal neuro exam.  Suspect muscular etiology of patient's pain today but considering he has not had any prior imaging well obtain plain film x-ray. Abdomen soft/nontender and without pulsatile mass. Patient with equal pedal pulses. Doubt spinal epidural abscess, cauda equina, kidney stone disease or other emergent pathologies at this time. - DG Lumbar: IMPRESSION:  Mild degenerative changes are noted. No acute abnormality is noted.  - Patient updated on x-ray findings today, he will be referred to orthopedist for further treatment of low back pain with sciatica.  Patient denies history of diabetes, will treat with prednisone burst. Naproxen 500mg  BID prescribed. Patient denies history of CKD or gastric ulcers/bleeding. Robaxin 500mg  BID prescribed. Patient informed to avoid driving or operating heavy machinery while taking muscle relaxer.  Patient noted to have slightly elevated blood pressure today 145/87, suspect this may be secondary to his pain today.  He reports he does not take any medications daily.  I have asked patient to follow-up with his PCP within 1 week for blood pressure medication and see if antihypertensives would be indicated.  He is asymptomatic regarding his elevated blood pressure reading today, he has been advised of signs/symptoms of hypertensive urgency/emergency and to return to the ER if they occur  At  this time there does not appear to be any evidence of an acute emergency medical condition and the patient appears stable for discharge  with appropriate outpatient follow up. Diagnosis was discussed with patient who verbalizes understanding of care plan and is agreeable to discharge. I have discussed return precautions with patient who verbalizes understanding. Patient encouraged to follow-up with their PCP and Ortho. All questions answered.   Note: Portions of this report may have been transcribed using voice recognition software. Every effort was made to ensure accuracy; however, inadvertent computerized transcription errors may still be present. Final Clinical Impression(s) / ED Diagnoses Final diagnoses:  Acute bilateral low back pain with left-sided sciatica  Elevated blood pressure reading    Rx / DC Orders ED Discharge Orders         Ordered    lidocaine (LIDODERM) 5 %  Every 24 hours        03/09/20 1533    methocarbamol (ROBAXIN) 500 MG tablet  2 times daily        03/09/20 1533    naproxen (NAPROSYN) 375 MG tablet  2 times daily        03/09/20 1533    predniSONE (DELTASONE) 10 MG tablet  Daily        03/09/20 1533           Elizabeth PalauMorelli, Sylas Twombly A, PA-C 03/09/20 1538    Blane OharaZavitz, Joshua, MD 03/09/20 1622

## 2020-03-09 NOTE — ED Triage Notes (Signed)
Patient complains of sudden onset of back that started on Monday when he got out of bed. States when he got out of bed on Monday he felt a pop and the pain started.

## 2020-03-09 NOTE — Discharge Instructions (Signed)
At this time there does not appear to be the presence of an emergent medical condition, however there is always the potential for conditions to change. Please read and follow the below instructions.  Please return to the Emergency Department immediately for any new or worsening symptoms. Please be sure to follow up with your Primary Care Provider within one week regarding your visit today; please call their office to schedule an appointment even if you are feeling better for a follow-up visit. You may use the muscle relaxer Robaxin as prescribed to help with your symptoms.  Do not drive or operate heavy machinery while taking Robaxin as it will make you drowsy.  Do not drink alcohol or take other sedating medications while taking Robaxin as this will worsen side effects. You have been prescribed an NSAID-containing medication called Naproxen today.  Do not take the medications including ibuprofen, Aleve, Advil or other NSAID-containing medications while taking Naproxen.  Please be sure to drink enough water. You may use the Lidoderm patch as prescribed to help with your symptoms.  Lidoderm may be expensive so you may speak with your pharmacist about finding over-the-counter medications that work similarly. You may take the steroid medication prednisone as prescribed to help with your symptoms. You were given a pain pill in the emergency department that will make you drowsy.  Do not drive, drink alcohol or perform any other potential dangerous activities for the rest of the day. Your blood pressure was elevated in the emergency department today.  Please have her rechecked by your primary care provider at your follow-up visit this week and discuss medication management if indicated at that time. You may call the orthopedic specialist Dr. Jena Gauss on your discharge paperwork for follow-up regarding her back pain.  Go to the nearest Emergency Department immediately if: You have fever or chills You cannot  control when you pee (urinate) or poop (have a bowel movement). You have weakness in any of these areas and it gets worse: Lower back. The area between your hip bones. Butt. Legs. You have redness or swelling of your back. You have a burning feeling when you pee. Get a very bad headache. Start to feel mixed up (confused). Feel weak or numb. Feel faint. Have very bad pain in your: Chest. Belly (abdomen). Throw up more than once. Have trouble breathing. You have any new/concerning or worsening of symptoms   Please read the additional information packets attached to your discharge summary.  Do not take your medicine if  develop an itchy rash, swelling in your mouth or lips, or difficulty breathing; call 911 and seek immediate emergency medical attention if this occurs.  You may review your lab tests and imaging results in their entirety on your MyChart account.  Please discuss all results of fully with your primary care provider and other specialist at your follow-up visit.  Note: Portions of this text may have been transcribed using voice recognition software. Every effort was made to ensure accuracy; however, inadvertent computerized transcription errors may still be present.
